# Patient Record
Sex: Female | Born: 1970 | Race: White | Hispanic: No | Marital: Married | Smoking: Current every day smoker
Health system: Southern US, Community
[De-identification: ages and names within clinical notes are randomized; demographics above are authoritative.]

## PROBLEM LIST (undated history)

## (undated) DIAGNOSIS — G8929 Other chronic pain: Secondary | ICD-10-CM

## (undated) DIAGNOSIS — E039 Hypothyroidism, unspecified: Secondary | ICD-10-CM

## (undated) DIAGNOSIS — M545 Low back pain: Secondary | ICD-10-CM

## (undated) DIAGNOSIS — F419 Anxiety disorder, unspecified: Secondary | ICD-10-CM

## (undated) DIAGNOSIS — M549 Dorsalgia, unspecified: Secondary | ICD-10-CM

## (undated) DIAGNOSIS — M5126 Other intervertebral disc displacement, lumbar region: Secondary | ICD-10-CM

## (undated) DIAGNOSIS — T7840XA Allergy, unspecified, initial encounter: Secondary | ICD-10-CM

## (undated) DIAGNOSIS — M5416 Radiculopathy, lumbar region: Secondary | ICD-10-CM

## (undated) HISTORY — DX: Anxiety disorder, unspecified: F41.9

## (undated) HISTORY — DX: Other intervertebral disc displacement, lumbar region: M51.26

## (undated) HISTORY — PX: CARPAL TUNNEL RELEASE: SHX101

## (undated) HISTORY — PX: BACK SURGERY: SHX140

## (undated) HISTORY — DX: Low back pain: M54.5

## (undated) HISTORY — DX: Dorsalgia, unspecified: M54.9

## (undated) HISTORY — DX: Radiculopathy, lumbar region: M54.16

## (undated) HISTORY — PX: TUBAL LIGATION: SHX77

## (undated) HISTORY — DX: Allergy, unspecified, initial encounter: T78.40XA

## (undated) HISTORY — PX: ABDOMINAL HYSTERECTOMY: SHX81

## (undated) HISTORY — PX: SPINE SURGERY: SHX786

---

## 1999-11-10 ENCOUNTER — Emergency Department (HOSPITAL_COMMUNITY): Admission: EM | Admit: 1999-11-10 | Discharge: 1999-11-10 | Payer: Self-pay | Admitting: Emergency Medicine

## 2007-05-17 ENCOUNTER — Ambulatory Visit: Payer: Self-pay | Admitting: Emergency Medicine

## 2010-04-25 ENCOUNTER — Ambulatory Visit: Payer: Self-pay

## 2010-10-12 ENCOUNTER — Ambulatory Visit: Payer: Self-pay | Admitting: Family Medicine

## 2010-10-14 ENCOUNTER — Ambulatory Visit: Payer: Self-pay | Admitting: Internal Medicine

## 2011-04-12 ENCOUNTER — Ambulatory Visit: Payer: Self-pay

## 2011-04-25 ENCOUNTER — Ambulatory Visit: Payer: Self-pay

## 2011-04-26 LAB — PATHOLOGY REPORT

## 2011-05-04 ENCOUNTER — Ambulatory Visit: Payer: Self-pay

## 2014-01-29 ENCOUNTER — Other Ambulatory Visit: Payer: Self-pay | Admitting: Family Medicine

## 2014-01-29 ENCOUNTER — Other Ambulatory Visit (HOSPITAL_COMMUNITY)
Admission: RE | Admit: 2014-01-29 | Discharge: 2014-01-29 | Disposition: A | Discharge status: Home / Self Care | Payer: 59 | Source: Ambulatory Visit | Attending: Family Medicine | Admitting: Family Medicine

## 2014-01-29 DIAGNOSIS — Z124 Encounter for screening for malignant neoplasm of cervix: Secondary | ICD-10-CM | POA: Insufficient documentation

## 2015-11-02 ENCOUNTER — Other Ambulatory Visit: Payer: Self-pay | Admitting: Neurosurgery

## 2015-11-02 ENCOUNTER — Ambulatory Visit
Admission: RE | Admit: 2015-11-02 | Discharge: 2015-11-02 | Disposition: A | Discharge status: Home / Self Care | Payer: 59 | Source: Ambulatory Visit | Attending: Neurosurgery | Admitting: Neurosurgery

## 2015-11-02 DIAGNOSIS — M5416 Radiculopathy, lumbar region: Secondary | ICD-10-CM

## 2015-11-02 MED ORDER — GADOBENATE DIMEGLUMINE 529 MG/ML IV SOLN
13.0000 mL | Freq: Once | INTRAVENOUS | Status: AC | PRN
  Administered 2015-11-02: 13 mL via INTRAVENOUS

## 2015-11-08 ENCOUNTER — Other Ambulatory Visit: Payer: Self-pay | Admitting: Neurosurgery

## 2015-11-10 NOTE — Pre-Procedure Instructions (Signed)
    Latoya Brady  11/10/2015      [facility] [phone] - [place] - [address] AT [facility] [address] [place] [id number] Phone: [phone] Fax: [phone]    Your procedure is scheduled on Tuesday, December 6th, 2016.  [facility] at 9:30 A.M.  Call this number if you have problems the morning of surgery:  [phone]   Remember:  Do not eat food or drink liquids after midnight.   Take these medicines the morning of surgery with A SIP OF WATER: Gabapentin (Neurontin), Levothyroxine (Synthroid), Tramadol (Ultram) if needed.  Stop taking: Aspirin, NSAIDS, Aleve, Naproxen, Ibuprofen, Advil, Motrin, BC's, Goody's, fish oil, all herbal medications, and all vitamins.    Do not wear jewelry, make-up or nail polish.  Do not wear lotions, powders, or perfumes.  You may wear deodorant.  Do not shave 48 hours prior to surgery.    Do not bring valuables to the hospital.  [facility] is not responsible for any belongings or valuables.  Contacts, dentures or bridgework may not be worn into surgery.  Leave your suitcase in the car.  After surgery it may be brought to your room.  For patients admitted to the hospital, discharge time will be determined by your treatment team.  Patients discharged the day of surgery will not be allowed to drive home.   Special instructions:  See attached.   Please read over the following fact sheets that you were given. Pain Booklet, Coughing and Deep Breathing, Blood Transfusion Information, MRSA Information and Surgical Site Infection Prevention

## 2015-11-11 ENCOUNTER — Encounter (HOSPITAL_COMMUNITY)
Admission: RE | Admit: 2015-11-11 | Discharge: 2015-11-11 | Disposition: A | Discharge status: Home / Self Care | Payer: 59 | Source: Ambulatory Visit | Attending: Neurosurgery | Admitting: Neurosurgery

## 2015-11-11 ENCOUNTER — Encounter (HOSPITAL_COMMUNITY): Payer: Self-pay

## 2015-11-11 DIAGNOSIS — Z0183 Encounter for blood typing: Secondary | ICD-10-CM | POA: Diagnosis not present

## 2015-11-11 DIAGNOSIS — Z01812 Encounter for preprocedural laboratory examination: Secondary | ICD-10-CM | POA: Insufficient documentation

## 2015-11-11 DIAGNOSIS — M5416 Radiculopathy, lumbar region: Secondary | ICD-10-CM | POA: Diagnosis not present

## 2015-11-11 HISTORY — DX: Hypothyroidism, unspecified: E03.9

## 2015-11-11 LAB — SURGICAL PCR SCREEN
MRSA, PCR: NEGATIVE
Staphylococcus aureus: NEGATIVE

## 2015-11-11 LAB — BASIC METABOLIC PANEL
ANION GAP: 10 (ref 5–15)
BUN: 9 mg/dL (ref 6–20)
CALCIUM: 9.7 mg/dL (ref 8.9–10.3)
CO2: 22 mmol/L (ref 22–32)
CREATININE: 0.59 mg/dL (ref 0.44–1.00)
Chloride: 108 mmol/L (ref 101–111)
Glucose, Bld: 90 mg/dL (ref 65–99)
Potassium: 4.2 mmol/L (ref 3.5–5.1)
SODIUM: 140 mmol/L (ref 135–145)

## 2015-11-11 LAB — CBC
HCT: 45.4 % (ref 36.0–46.0)
Hemoglobin: 15.8 g/dL — ABNORMAL HIGH (ref 12.0–15.0)
MCH: 30.6 pg (ref 26.0–34.0)
MCHC: 34.8 g/dL (ref 30.0–36.0)
MCV: 88 fL (ref 78.0–100.0)
PLATELETS: 254 10*3/uL (ref 150–400)
RBC: 5.16 MIL/uL — ABNORMAL HIGH (ref 3.87–5.11)
RDW: 13.1 % (ref 11.5–15.5)
WBC: 8.4 10*3/uL (ref 4.0–10.5)

## 2015-11-11 LAB — ABO/RH: ABO/RH(D): O POS

## 2015-11-11 LAB — TYPE AND SCREEN
ABO/RH(D): O POS
Antibody Screen: NEGATIVE

## 2015-11-11 NOTE — Progress Notes (Signed)
PCP is Dr Juluis RainierElizabeth Barnes Denies ever seeing a cardiologist. Denies ever having a card cath, stress test, or echo. Denies chest pain.

## 2015-11-15 MED ORDER — CEFAZOLIN SODIUM-DEXTROSE 2-3 GM-% IV SOLR
2.0000 g | INTRAVENOUS | Status: AC
  Administered 2015-11-16: 2 g via INTRAVENOUS
  Filled 2015-11-15 (×2): qty 50

## 2015-11-15 NOTE — H&P (Signed)
Latoya StampsSabrina E Brady is an 44 y.o. female.   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  Past Medical History  Diagnosis Date  . Hypothyroidism     Past Surgical History  Procedure Laterality Date  . Abdominal hysterectomy    . Back surgery    . Carpal tunnel release Bilateral     No family history on file. Social History:  reports that she has been smoking Cigarettes.  She has a 12.5 pack-year smoking history. She does not have any smokeless tobacco history on file. She reports that she does not drink alcohol or use illicit drugs.  Allergies:  Allergies  Allergen Reactions  . Macrobid [Nitrofurantoin [employer] CoMonohyd Macro]     Felt like I had pneumonia    No prescriptions prior to admission    No results found for this or any previous visit (from the past 48 hour(s)). No results found.  Review of Systems  Constitutional: Negative.   HENT: Negative.   Eyes: Negative.   Respiratory: Negative.   Cardiovascular: Negative.   Gastrointestinal: Negative.   Musculoskeletal: Positive for back pain.  Skin: Negative.   Neurological: Positive for sensory change and focal weakness.  Endo/Heme/Allergies: Negative.   Psychiatric/Behavioral: Negative.     There were no vitals taken for this visit. Physical Exam hent, nl. Neck, nk. Cv, nl. Lungs,clear. Abdomen, nl. Extremities, nl. NEURO  Can not sit. PF of right foot at 2/5. 3-4/ DF. SLR positive at 30 both. Mri shoiws recurrent hnp Assessment/Plan Patient to go ahead with decompression and fusion at l5-s1. She is aware of risks and benefits  Latoya Brady M 11/15/2015, 3:21 PM

## 2015-11-16 ENCOUNTER — Inpatient Hospital Stay (HOSPITAL_COMMUNITY)
Admission: RE | Admit: 2015-11-16 | Discharge: 2015-11-19 | DRG: 460 | Disposition: A | Discharge status: Home / Self Care | Payer: 59 | Source: Ambulatory Visit | Attending: Neurosurgery | Admitting: Neurosurgery

## 2015-11-16 ENCOUNTER — Encounter (HOSPITAL_COMMUNITY): Payer: Self-pay | Admitting: *Deleted

## 2015-11-16 ENCOUNTER — Inpatient Hospital Stay (HOSPITAL_COMMUNITY): Payer: 59 | Admitting: Anesthesiology

## 2015-11-16 ENCOUNTER — Encounter (HOSPITAL_COMMUNITY)
Admission: RE | Disposition: A | Discharge status: Home / Self Care | Payer: 59 | Source: Ambulatory Visit | Attending: Neurosurgery

## 2015-11-16 ENCOUNTER — Inpatient Hospital Stay (HOSPITAL_COMMUNITY): Payer: 59

## 2015-11-16 DIAGNOSIS — Z419 Encounter for procedure for purposes other than remedying health state, unspecified: Secondary | ICD-10-CM

## 2015-11-16 DIAGNOSIS — Z881 Allergy status to other antibiotic agents status: Secondary | ICD-10-CM

## 2015-11-16 DIAGNOSIS — M5126 Other intervertebral disc displacement, lumbar region: Secondary | ICD-10-CM

## 2015-11-16 DIAGNOSIS — E039 Hypothyroidism, unspecified: Secondary | ICD-10-CM | POA: Diagnosis present

## 2015-11-16 DIAGNOSIS — F17210 Nicotine dependence, cigarettes, uncomplicated: Secondary | ICD-10-CM | POA: Diagnosis present

## 2015-11-16 DIAGNOSIS — M5417 Radiculopathy, lumbosacral region: Secondary | ICD-10-CM | POA: Diagnosis present

## 2015-11-16 HISTORY — DX: Other intervertebral disc displacement, lumbar region: M51.26

## 2015-11-16 SURGERY — POSTERIOR LUMBAR FUSION 1 LEVEL
Anesthesia: General

## 2015-11-16 MED ORDER — MORPHINE SULFATE 2 MG/ML IV SOLN
INTRAVENOUS | Status: AC
  Filled 2015-11-16: qty 25

## 2015-11-16 MED ORDER — PROPOFOL 10 MG/ML IV BOLUS
INTRAVENOUS | Status: DC | PRN
  Administered 2015-11-16: 140 mg via INTRAVENOUS

## 2015-11-16 MED ORDER — FENTANYL CITRATE (PF) 250 MCG/5ML IJ SOLN
INTRAMUSCULAR | Status: AC
  Filled 2015-11-16: qty 5

## 2015-11-16 MED ORDER — PHENYLEPHRINE 40 MCG/ML (10ML) SYRINGE FOR IV PUSH (FOR BLOOD PRESSURE SUPPORT)
PREFILLED_SYRINGE | INTRAVENOUS | Status: AC
  Filled 2015-11-16: qty 10

## 2015-11-16 MED ORDER — ONDANSETRON HCL 4 MG/2ML IJ SOLN
INTRAMUSCULAR | Status: AC
  Administered 2015-11-16: 4 mg
  Filled 2015-11-16: qty 2

## 2015-11-16 MED ORDER — VANCOMYCIN HCL 1000 MG IV SOLR
INTRAVENOUS | Status: AC
  Filled 2015-11-16: qty 1000

## 2015-11-16 MED ORDER — PHENOL 1.4 % MT LIQD: 1.0000 | OROMUCOSAL | Status: DC | PRN

## 2015-11-16 MED ORDER — CEFAZOLIN SODIUM 1-5 GM-% IV SOLN
1.0000 g | Freq: Three times a day (TID) | INTRAVENOUS | Status: AC
  Administered 2015-11-16 – 2015-11-17 (×2): 1 g via INTRAVENOUS
  Filled 2015-11-16 (×2): qty 50

## 2015-11-16 MED ORDER — THROMBIN 5000 UNITS EX SOLR
OROMUCOSAL | Status: DC | PRN
  Administered 2015-11-16: 5 mL via TOPICAL

## 2015-11-16 MED ORDER — ONDANSETRON HCL 4 MG/2ML IJ SOLN
4.0000 mg | Freq: Four times a day (QID) | INTRAMUSCULAR | Status: DC | PRN
Start: 2015-11-16 — End: 2015-11-17
  Administered 2015-11-17: 4 mg via INTRAVENOUS
  Filled 2015-11-16: qty 2

## 2015-11-16 MED ORDER — SODIUM CHLORIDE 0.9 % IJ SOLN: 3.0000 mL | INTRAMUSCULAR | Status: DC | PRN

## 2015-11-16 MED ORDER — DEXAMETHASONE SODIUM PHOSPHATE 10 MG/ML IJ SOLN
INTRAMUSCULAR | Status: DC | PRN
  Administered 2015-11-16: 10 mg via INTRAVENOUS

## 2015-11-16 MED ORDER — 0.9 % SODIUM CHLORIDE (POUR BTL) OPTIME
TOPICAL | Status: DC | PRN
  Administered 2015-11-16: 1000 mL

## 2015-11-16 MED ORDER — FENTANYL CITRATE (PF) 250 MCG/5ML IJ SOLN
INTRAMUSCULAR | Status: DC | PRN
  Administered 2015-11-16 (×2): 50 ug via INTRAVENOUS
  Administered 2015-11-16: 100 ug via INTRAVENOUS
  Administered 2015-11-16 (×2): 50 ug via INTRAVENOUS

## 2015-11-16 MED ORDER — ONDANSETRON HCL 4 MG/2ML IJ SOLN
INTRAMUSCULAR | Status: AC
  Filled 2015-11-16: qty 6

## 2015-11-16 MED ORDER — OXYCODONE HCL 5 MG/5ML PO SOLN: 5.0000 mg | Freq: Once | ORAL | Status: DC | PRN

## 2015-11-16 MED ORDER — SODIUM CHLORIDE 0.9 % IJ SOLN: 9.0000 mL | INTRAMUSCULAR | Status: DC | PRN

## 2015-11-16 MED ORDER — ONDANSETRON HCL 4 MG/2ML IJ SOLN
INTRAMUSCULAR | Status: DC | PRN
  Administered 2015-11-16: 4 mg via INTRAVENOUS

## 2015-11-16 MED ORDER — NALOXONE HCL 0.4 MG/ML IJ SOLN: 0.4000 mg | INTRAMUSCULAR | Status: DC | PRN

## 2015-11-16 MED ORDER — LIDOCAINE HCL (CARDIAC) 20 MG/ML IV SOLN
INTRAVENOUS | Status: DC | PRN
  Administered 2015-11-16: 60 mg via INTRAVENOUS

## 2015-11-16 MED ORDER — MIDAZOLAM HCL 2 MG/2ML IJ SOLN
INTRAMUSCULAR | Status: AC
  Filled 2015-11-16: qty 2

## 2015-11-16 MED ORDER — SODIUM CHLORIDE 0.9 % IV SOLN: 250.0000 mL | INTRAVENOUS | Status: DC

## 2015-11-16 MED ORDER — MORPHINE SULFATE 2 MG/ML IV SOLN
INTRAVENOUS | Status: DC
  Administered 2015-11-16: 6 mg via INTRAVENOUS
  Administered 2015-11-16: 1.5 mg via INTRAVENOUS
  Administered 2015-11-17: 10.5 mg via INTRAVENOUS
  Administered 2015-11-17: 7.5 mg via INTRAVENOUS

## 2015-11-16 MED ORDER — OXYCODONE HCL 5 MG PO TABS: 5.0000 mg | ORAL_TABLET | Freq: Once | ORAL | Status: DC | PRN

## 2015-11-16 MED ORDER — MENTHOL 3 MG MT LOZG: 1.0000 | LOZENGE | OROMUCOSAL | Status: DC | PRN

## 2015-11-16 MED ORDER — ACETAMINOPHEN 650 MG RE SUPP: 650.0000 mg | RECTAL | Status: DC | PRN

## 2015-11-16 MED ORDER — LEVOTHYROXINE SODIUM 75 MCG PO TABS
75.0000 ug | ORAL_TABLET | Freq: Every day | ORAL | Status: DC
  Administered 2015-11-17 – 2015-11-19 (×3): 75 ug via ORAL
  Filled 2015-11-16: qty 1
  Filled 2015-11-16 (×3): qty 3
  Filled 2015-11-16 (×2): qty 1
  Filled 2015-11-16: qty 3
  Filled 2015-11-16: qty 1

## 2015-11-16 MED ORDER — OXYCODONE-ACETAMINOPHEN 5-325 MG PO TABS
1.0000 | ORAL_TABLET | ORAL | Status: DC | PRN
  Administered 2015-11-17: 2 via ORAL
  Administered 2015-11-17: 1 via ORAL
  Administered 2015-11-17 – 2015-11-18 (×3): 2 via ORAL
  Administered 2015-11-18: 1 via ORAL
  Administered 2015-11-18 – 2015-11-19 (×4): 2 via ORAL
  Filled 2015-11-16: qty 1
  Filled 2015-11-16 (×10): qty 2

## 2015-11-16 MED ORDER — ACETAMINOPHEN 325 MG PO TABS: 325.0000 mg | ORAL_TABLET | ORAL | Status: DC | PRN

## 2015-11-16 MED ORDER — SODIUM CHLORIDE 0.9 % IV SOLN
INTRAVENOUS | Status: DC
  Administered 2015-11-16: 21:00:00 via INTRAVENOUS

## 2015-11-16 MED ORDER — VANCOMYCIN HCL 1000 MG IV SOLR
INTRAVENOUS | Status: DC | PRN
  Administered 2015-11-16: 1000 mg via TOPICAL

## 2015-11-16 MED ORDER — PROPOFOL 10 MG/ML IV BOLUS
INTRAVENOUS | Status: AC
  Filled 2015-11-16: qty 20

## 2015-11-16 MED ORDER — ACETAMINOPHEN 325 MG PO TABS: 650.0000 mg | ORAL_TABLET | ORAL | Status: DC | PRN

## 2015-11-16 MED ORDER — ZOLPIDEM TARTRATE 5 MG PO TABS: 5.0000 mg | ORAL_TABLET | Freq: Every evening | ORAL | Status: DC | PRN

## 2015-11-16 MED ORDER — HYDROMORPHONE HCL 1 MG/ML IJ SOLN
INTRAMUSCULAR | Status: AC
  Filled 2015-11-16: qty 1

## 2015-11-16 MED ORDER — DIPHENHYDRAMINE HCL 50 MG/ML IJ SOLN: 12.5000 mg | Freq: Four times a day (QID) | INTRAMUSCULAR | Status: DC | PRN

## 2015-11-16 MED ORDER — ONDANSETRON HCL 4 MG/2ML IJ SOLN: 4.0000 mg | INTRAMUSCULAR | Status: DC | PRN

## 2015-11-16 MED ORDER — BUPIVACAINE LIPOSOME 1.3 % IJ SUSP
20.0000 mL | INTRAMUSCULAR | Status: AC
  Administered 2015-11-16: 20 mL
  Filled 2015-11-16: qty 20

## 2015-11-16 MED ORDER — PHENYLEPHRINE HCL 10 MG/ML IJ SOLN
INTRAMUSCULAR | Status: DC | PRN
  Administered 2015-11-16 (×3): 40 ug via INTRAVENOUS

## 2015-11-16 MED ORDER — DIAZEPAM 5 MG PO TABS
5.0000 mg | ORAL_TABLET | Freq: Four times a day (QID) | ORAL | Status: DC | PRN
  Administered 2015-11-16 – 2015-11-19 (×4): 5 mg via ORAL
  Filled 2015-11-16 (×4): qty 1

## 2015-11-16 MED ORDER — SENNOSIDES-DOCUSATE SODIUM 8.6-50 MG PO TABS: 1.0000 | ORAL_TABLET | Freq: Every evening | ORAL | Status: DC | PRN

## 2015-11-16 MED ORDER — DIPHENHYDRAMINE HCL 12.5 MG/5ML PO ELIX: 12.5000 mg | ORAL_SOLUTION | Freq: Four times a day (QID) | ORAL | Status: DC | PRN

## 2015-11-16 MED ORDER — ROCURONIUM BROMIDE 100 MG/10ML IV SOLN
INTRAVENOUS | Status: DC | PRN
  Administered 2015-11-16: 50 mg via INTRAVENOUS

## 2015-11-16 MED ORDER — GABAPENTIN 300 MG PO CAPS
300.0000 mg | ORAL_CAPSULE | Freq: Three times a day (TID) | ORAL | Status: DC
  Administered 2015-11-16 – 2015-11-19 (×8): 300 mg via ORAL
  Filled 2015-11-16 (×8): qty 1

## 2015-11-16 MED ORDER — HYDROMORPHONE HCL 1 MG/ML IJ SOLN
0.2500 mg | INTRAMUSCULAR | Status: DC | PRN
  Administered 2015-11-16 (×4): 0.5 mg via INTRAVENOUS

## 2015-11-16 MED ORDER — LACTATED RINGERS IV SOLN
INTRAVENOUS | Status: DC
  Administered 2015-11-16 (×3): via INTRAVENOUS

## 2015-11-16 MED ORDER — THROMBIN 20000 UNITS EX SOLR
CUTANEOUS | Status: DC | PRN
  Administered 2015-11-16: 20 mL via TOPICAL

## 2015-11-16 MED ORDER — ACETAMINOPHEN 160 MG/5ML PO SOLN: 325.0000 mg | ORAL | Status: DC | PRN

## 2015-11-16 MED ORDER — SODIUM CHLORIDE 0.9 % IJ SOLN
3.0000 mL | Freq: Two times a day (BID) | INTRAMUSCULAR | Status: DC
Start: 2015-11-16 — End: 2015-11-19
  Administered 2015-11-17 – 2015-11-18 (×2): 3 mL via INTRAVENOUS

## 2015-11-16 SURGICAL SUPPLY — 77 items
APL SKNCLS STERI-STRIP NONHPOA (GAUZE/BANDAGES/DRESSINGS) ×1
BENZOIN TINCTURE PRP APPL 2/3 (GAUZE/BANDAGES/DRESSINGS) ×3 IMPLANT
BLADE CLIPPER SURG (BLADE) IMPLANT
BUR ACORN 6.0 (BURR) ×2 IMPLANT
BUR ACORN 6.0MM (BURR) ×1
BUR MATCHSTICK NEURO 3.0 LAGG (BURR) ×5 IMPLANT
CANISTER SUCT 3000ML PPV (MISCELLANEOUS) ×3 IMPLANT
CAP LOCKING THREADED (Cap) ×8 IMPLANT
CLOSURE WOUND 1/2 X4 (GAUZE/BANDAGES/DRESSINGS) ×1
CONT SPEC 4OZ CLIKSEAL STRL BL (MISCELLANEOUS) ×3 IMPLANT
COVER BACK TABLE 60X90IN (DRAPES) ×3 IMPLANT
DRAPE C-ARM 42X72 X-RAY (DRAPES) ×6 IMPLANT
DRAPE LAPAROTOMY 100X72X124 (DRAPES) ×3 IMPLANT
DRAPE MICROSCOPE LEICA (MISCELLANEOUS) ×6 IMPLANT
DRAPE POUCH INSTRU U-SHP 10X18 (DRAPES) ×3 IMPLANT
DRSG OPSITE POSTOP 4X6 (GAUZE/BANDAGES/DRESSINGS) ×2 IMPLANT
DRSG PAD ABDOMINAL 8X10 ST (GAUZE/BANDAGES/DRESSINGS) IMPLANT
DURAPREP 26ML APPLICATOR (WOUND CARE) ×3 IMPLANT
ELECT BLADE 4.0 EZ CLEAN MEGAD (MISCELLANEOUS) ×6
ELECT REM PT RETURN 9FT ADLT (ELECTROSURGICAL) ×3
ELECTRODE BLDE 4.0 EZ CLN MEGD (MISCELLANEOUS) IMPLANT
ELECTRODE REM PT RTRN 9FT ADLT (ELECTROSURGICAL) ×1 IMPLANT
EVACUATOR 1/8 PVC DRAIN (DRAIN) IMPLANT
GAUZE SPONGE 4X4 12PLY STRL (GAUZE/BANDAGES/DRESSINGS) ×1 IMPLANT
GAUZE SPONGE 4X4 16PLY XRAY LF (GAUZE/BANDAGES/DRESSINGS) ×3 IMPLANT
GLOVE BIOGEL M 8.0 STRL (GLOVE) ×5 IMPLANT
GLOVE BIOGEL PI IND STRL 7.5 (GLOVE) IMPLANT
GLOVE BIOGEL PI INDICATOR 7.5 (GLOVE) ×2
GLOVE EXAM NITRILE LRG STRL (GLOVE) IMPLANT
GLOVE EXAM NITRILE MD LF STRL (GLOVE) IMPLANT
GLOVE EXAM NITRILE XL STR (GLOVE) IMPLANT
GLOVE EXAM NITRILE XS STR PU (GLOVE) ×2 IMPLANT
GLOVE SS BIOGEL STRL SZ 7 (GLOVE) IMPLANT
GLOVE SUPERSENSE BIOGEL SZ 7 (GLOVE) ×2
GOWN STRL REUS W/ TWL LRG LVL3 (GOWN DISPOSABLE) ×1 IMPLANT
GOWN STRL REUS W/ TWL XL LVL3 (GOWN DISPOSABLE) IMPLANT
GOWN STRL REUS W/TWL 2XL LVL3 (GOWN DISPOSABLE) IMPLANT
GOWN STRL REUS W/TWL LRG LVL3 (GOWN DISPOSABLE) ×6
GOWN STRL REUS W/TWL XL LVL3 (GOWN DISPOSABLE) ×9
KIT BASIN OR (CUSTOM PROCEDURE TRAY) ×3 IMPLANT
KIT INFUSE MEDIUM (Orthopedic Implant) ×2 IMPLANT
KIT ROOM TURNOVER OR (KITS) ×3 IMPLANT
NDL HYPO 18GX1.5 BLUNT FILL (NEEDLE) IMPLANT
NDL HYPO 21X1.5 SAFETY (NEEDLE) IMPLANT
NDL HYPO 25X1 1.5 SAFETY (NEEDLE) IMPLANT
NEEDLE HYPO 18GX1.5 BLUNT FILL (NEEDLE) IMPLANT
NEEDLE HYPO 21X1.5 SAFETY (NEEDLE) ×3 IMPLANT
NEEDLE HYPO 25X1 1.5 SAFETY (NEEDLE) ×3 IMPLANT
NS IRRIG 1000ML POUR BTL (IV SOLUTION) ×3 IMPLANT
PACK LAMINECTOMY NEURO (CUSTOM PROCEDURE TRAY) ×3 IMPLANT
PAD ARMBOARD 7.5X6 YLW CONV (MISCELLANEOUS) ×9 IMPLANT
PATTIES SURGICAL .5 X1 (DISPOSABLE) ×3 IMPLANT
PATTIES SURGICAL .5 X3 (DISPOSABLE) IMPLANT
PATTIES SURGICAL 1X1 (DISPOSABLE) ×2 IMPLANT
PENCIL BUTTON HOLSTER BLD 10FT (ELECTRODE) ×2 IMPLANT
ROD CREO 45MM SPINAL (Rod) ×4 IMPLANT
RUBBERBAND STERILE (MISCELLANEOUS) ×12 IMPLANT
SCREW 5.5X35MM (Screw) ×9 IMPLANT
SCREW BN 35X5.5XPA (Screw) IMPLANT
SCREW SPINE 40X5.5XPA CREO (Screw) IMPLANT
SCREW SPINE CREO 5.5X40 (Screw) ×3 IMPLANT
SPACER RISE 8X22 11-17MM-15 (Spacer) ×4 IMPLANT
SPONGE LAP 4X18 X RAY DECT (DISPOSABLE) IMPLANT
SPONGE NEURO XRAY DETECT 1X3 (DISPOSABLE) IMPLANT
SPONGE SURGIFOAM ABS GEL 100 (HEMOSTASIS) ×3 IMPLANT
STRIP CLOSURE SKIN 1/2X4 (GAUZE/BANDAGES/DRESSINGS) ×2 IMPLANT
SUT PROLENE 5 0 C1 (SUTURE) ×2 IMPLANT
SUT VIC AB 1 CT1 18XBRD ANBCTR (SUTURE) ×2 IMPLANT
SUT VIC AB 1 CT1 8-18 (SUTURE) ×3
SUT VIC AB 2-0 CP2 18 (SUTURE) ×3 IMPLANT
SUT VIC AB 3-0 SH 8-18 (SUTURE) ×3 IMPLANT
SWAB CULTURE ESWAB REG 1ML (MISCELLANEOUS) ×2 IMPLANT
SYR 5ML LL (SYRINGE) IMPLANT
TOWEL OR 17X24 6PK STRL BLUE (TOWEL DISPOSABLE) ×3 IMPLANT
TOWEL OR 17X26 10 PK STRL BLUE (TOWEL DISPOSABLE) ×3 IMPLANT
TRAY FOLEY W/METER SILVER 14FR (SET/KITS/TRAYS/PACK) ×3 IMPLANT
WATER STERILE IRR 1000ML POUR (IV SOLUTION) ×3 IMPLANT

## 2015-11-16 NOTE — Anesthesia Procedure Notes (Signed)
Procedure Name: Intubation Date/Time: 11/16/2015 1:17 PM Performed by: Marena ChancyBECKNER, Edelin Fryer S Pre-anesthesia Checklist: Patient identified, Timeout performed, Emergency Drugs available, Suction available and Patient being monitored Patient Re-evaluated:Patient Re-evaluated prior to inductionOxygen Delivery Method: Circle system utilized Preoxygenation: Pre-oxygenation with 100% oxygen Intubation Type: IV induction Ventilation: Mask ventilation without difficulty Laryngoscope Size: Miller and Mac Grade View: Grade I Tube type: Oral Tube size: 7.0 mm Number of attempts: 1 Placement Confirmation: ETT inserted through vocal cords under direct vision,  breath sounds checked- equal and bilateral and positive ETCO2 Tube secured with: Tape Dental Injury: Teeth and Oropharynx as per pre-operative assessment

## 2015-11-16 NOTE — Transfer of Care (Signed)
Immediate Anesthesia Transfer of Care Note  Patient: Latoya StampsSabrina E Brady  Procedure(s) Performed: Procedure(s) with comments: Lumbar Five-Sacral One Posterior lumbar interbody fusionwith Pedicle Screw Fixation (N/A) - Lumbar Five-Sacral One Posterior lumbar interbody fusionwith Pedicle Screw Fixation  Patient Location: PACU  Anesthesia Type:General  Level of Consciousness: awake, alert  and patient cooperative  Airway & Oxygen Therapy: Patient Spontanous Breathing and Patient connected to nasal cannula oxygen  Post-op Assessment: Report given to RN, Post -op Vital signs reviewed and stable and Patient moving all extremities  Post vital signs: Reviewed and stable  Last Vitals:  Filed Vitals:   11/16/15 0942 11/16/15 1715  BP: 122/89   Pulse: 93   Temp: 36.9 C 36.7 C  Resp: 20     Complications: No apparent anesthesia complications

## 2015-11-16 NOTE — Progress Notes (Signed)
Assessment performed by D. Melina Fiddleraye, RN

## 2015-11-16 NOTE — Anesthesia Preprocedure Evaluation (Signed)
Anesthesia Evaluation  Patient identified by MRN, date of birth, ID band Patient awake    Reviewed: Allergy & Precautions, NPO status , Patient's Chart, lab work & pertinent test results  History of Anesthesia Complications Negative for: history of anesthetic complications  Airway Mallampati: II  TM Distance: >3 FB Neck ROM: Full    Dental  (+) Teeth Intact   Pulmonary neg shortness of breath, neg sleep apnea, neg COPD, neg recent URI, Current Smoker, neg PE   breath sounds clear to auscultation       Cardiovascular negative cardio ROS   Rhythm:Regular     Neuro/Psych neg Seizures  Neuromuscular disease negative neurological ROS  negative psych ROS   GI/Hepatic negative GI ROS, Neg liver ROS,   Endo/Other  Hypothyroidism   Renal/GU negative Renal ROS     Musculoskeletal negative musculoskeletal ROS (+)   Abdominal   Peds  Hematology negative hematology ROS (+)   Anesthesia Other Findings   Reproductive/Obstetrics                             Anesthesia Physical Anesthesia Plan  ASA: II  Anesthesia Plan: General   Post-op Pain Management:    Induction: Intravenous  Airway Management Planned: Oral ETT  Additional Equipment: None  Intra-op Plan:   Post-operative Plan: Extubation in OR  Informed Consent: I have reviewed the patients History and Physical, chart, labs and discussed the procedure including the risks, benefits and alternatives for the proposed anesthesia with the patient or authorized representative who has indicated his/her understanding and acceptance.   Dental advisory given  Plan Discussed with: CRNA and Surgeon  Anesthesia Plan Comments:         Anesthesia Quick Evaluation

## 2015-11-17 MED ORDER — OXYCODONE HCL 5 MG PO TABS
10.0000 mg | ORAL_TABLET | ORAL | Status: DC | PRN
  Administered 2015-11-17 – 2015-11-19 (×8): 10 mg via ORAL
  Filled 2015-11-17 (×8): qty 2

## 2015-11-17 MED ORDER — DOCUSATE SODIUM 100 MG PO CAPS
100.0000 mg | ORAL_CAPSULE | Freq: Two times a day (BID) | ORAL | Status: DC
  Administered 2015-11-17 – 2015-11-19 (×4): 100 mg via ORAL
  Filled 2015-11-17 (×4): qty 1

## 2015-11-17 MED FILL — Heparin Sodium (Porcine) Inj 1000 Unit/ML: INTRAMUSCULAR | Qty: 30 | Status: AC

## 2015-11-17 MED FILL — Sodium Chloride IV Soln 0.9%: INTRAVENOUS | Qty: 1000 | Status: AC

## 2015-11-17 NOTE — Evaluation (Signed)
Physical Therapy Evaluation Patient Details Name: Latoya Brady MRN: [id number] DOB: 01-Jul-1971 Today's Date: 11/17/2015   History of Present Illness  s/p L5-S1 fusion  PMHx- discectomy L5-S1 06/2015 (pain returned/worsened after ~1 month per pt)  Clinical Impression  Patient is s/p above surgery resulting in the deficits listed below (see PT Problem List). Pt currently limited by severe pain and back brace not yet delivered. Pt was only able to tolerate sitting <5 minutes PTA and was the same today with pt pushing on armrest to lateral lean to left and relieve pressure on Rt LE. Discussed benefits of upright position and pt eager to walk vs sit. RN notified.  Patient will benefit from skilled PT to increase their independence and safety with mobility (while adhering to their precautions) to allow discharge to the venue listed below.     Follow Up Recommendations No PT follow up;Supervision for mobility/OOB    Equipment Recommendations  Rolling walker with 5" wheels    Recommendations for Other Services OT consult     Precautions / Restrictions Precautions Precautions: Back;Fall Precaution Booklet Issued: Yes (comment) Required Braces or Orthoses: Spinal Brace Spinal Brace: Lumbar corset;Applied in sitting position (brace not yet delivered)      Mobility  Bed Mobility Overal bed mobility: Needs Assistance Bed Mobility: Sit to Sidelying         Sit to sidelying: Min guard General bed mobility comments: vc for technique to prevent twisting; pt familiar with technique  Transfers Overall transfer level: Needs assistance Equipment used: Rolling walker (2 wheeled);None Transfers: Sit to/from [employer] to Stand: Min assist Stand pivot transfers: Min assist          Ambulation/Gait Ambulation/Gait assistance: Min assist Ambulation Distance (Feet): 3 Feet Assistive device: Rolling walker (2 wheeled) Gait Pattern/deviations: Step-to  pattern;Decreased stride length     General Gait Details: pivotal steps BSC to chair, chair to bed (no brace yet)  Stairs            Wheelchair Mobility    Modified Rankin (Stroke Patients Only)       Balance Overall balance assessment: Needs assistance         Standing balance support: No upper extremity supported Standing balance-Leahy Scale: Poor                               Pertinent Vitals/Pain Pain Assessment: 0-10 Pain Score: 10-Worst pain ever Pain Location: Back, down rt leg Pain Descriptors / Indicators: Operative site guarding;Discomfort Pain Intervention(s): Limited activity within patient's tolerance;Monitored during session;Premedicated before session;Repositioned    Home Living Family/patient expects to be discharged to:: Private residence Living Arrangements: Spouse/significant other Available Help at Discharge: Family;Available 24 hours/day Type of Home: House Home Access: Stairs to enter Entrance Stairs-Rails: Right (then bil, close together) Entrance Stairs-Number of Steps: 2+2 Home Layout: One level Home Equipment: None      Prior Function Level of Independence: Independent               Hand Dominance        Extremity/Trunk Assessment   Upper Extremity Assessment: Defer to OT evaluation;Overall WFL for tasks assessed           Lower Extremity Assessment: RLE deficits/detail;LLE deficits/detail      Cervical / Trunk Assessment: Other exceptions  Communication   Communication: No difficulties  Cognition Arousal/Alertness: Awake/alert Behavior During Therapy: WFL for tasks assessed/performed Overall Cognitive Status: Within  Functional Limits for tasks assessed                      General Comments      Exercises        Assessment/Plan    PT Assessment Patient needs continued PT services  PT Diagnosis Difficulty walking;Acute pain   PT Problem List Decreased activity  tolerance;Decreased balance;Decreased mobility;Decreased knowledge of use of DME;Decreased knowledge of precautions;Impaired sensation;Pain  PT Treatment Interventions DME instruction;Gait training;Stair training;Functional mobility training;Therapeutic activities;Patient/family education   PT Goals (Current goals can be found in the Care Plan section) Acute Rehab PT Goals Patient Stated Goal: decr pain and take care of herself PT Goal Formulation: With patient Time For Goal Achievement: 11/22/15 Potential to Achieve Goals: Good    Frequency Min 5X/week   Barriers to discharge        Co-evaluation               End of Session Equipment Utilized During Treatment: Oxygen Activity Tolerance: Patient limited by pain;Other (comment) (no brace yet) Patient left: in bed;with call bell/phone within reach;with bed alarm set;with family/visitor present Nurse Communication: Mobility status;Other (comment) (pain incr 5 to 10 with sitting; could only sit 5 min PTA)         Time: [id number] PT Time Calculation (min) (ACUTE ONLY): 26 min   Charges:   PT Evaluation $Initial PT Evaluation Tier I: 1 Procedure PT Treatments $Therapeutic Activity: 8-22 mins   PT G Codes:        Ancel Easler 11/17/2015, 8:54 AM Pager [phone]

## 2015-11-17 NOTE — Progress Notes (Signed)
Occupational Therapy Evaluation Patient Details Name: Latoya Brady MRN: [id number] DOB: [birth year] Today's Date: 11/17/2015    History of Present Illness s/p L5-S1 fusion  PMHx- discectomy L5-S1 06/2015 (pain returned/worsened after ~1 month per pt)   Clinical Impression   PTA, pt was independent with all ADL and functional mobility. Session limited by back brace not yet delivered. Pt completed toilet transfer and grooming tasks at sink with min guard assist for safety and min verbal cues to adhere to back precautions. Pt will benefit from skilled OT to increase independence and safety with ADLs and mobility to allow d/c home with assist from family.     Follow Up Recommendations  No OT follow up;Supervision - Intermittent    Equipment Recommendations  Other (comment);Tub/shower bench (RW-2 wheeled)    Recommendations for Other Services       Precautions / Restrictions Precautions Precautions: Back;Fall Precaution Comments: Pt able to recall 3/3 back precautions at beginning of session. Required Braces or Orthoses: Spinal Brace Spinal Brace: Lumbar corset;Applied in sitting position (brace not yet delivered) Restrictions Weight Bearing Restrictions: No      Mobility Bed Mobility               General bed mobility comments: Pt sitting EOB on arrival. Reviewed log roll technique for when pt ready to lay back down.   Transfers Overall transfer level: Needs assistance Equipment used: Rolling walker (2 wheeled);None Transfers: Sit to/from Stand Sit to Stand: Min guard         General transfer comment: Min guard for safety as pt reported not feeling steady on her feet yet. No overt LOB and no physical assist required    Balance Overall balance assessment: Needs assistance Sitting-balance support: No upper extremity supported;Feet supported Sitting balance-Leahy Scale: Fair     Standing balance support: No upper extremity supported;During functional  activity Standing balance-Leahy Scale: Fair                              ADL Overall ADL's : Needs assistance/impaired     Grooming: Oral care;Wash/dry hands;Min guard;Standing                   Toilet Transfer: Min guard;Ambulation;Comfort height toilet;Grab bars;RW   Toileting- Architect and Hygiene: Min guard;Adhering to back precautions;Sitting/lateral lean       Functional mobility during ADLs: Supervision/safety;Rolling walker General ADL Comments: Session limited by pt's back brace not delivered to pt's room yet. Pt demonstrating good adherence to back precautions during transfers and self-care tasks. Educated pt on two cup method for oral care and showed AE for LB ADLs. Discussed safe footwear and other fall prevention strategies with pt and husband.      Vision Vision Assessment?: No apparent visual deficits   Perception     Praxis      Pertinent Vitals/Pain Pain Assessment: 0-10 Pain Score: 2  Pain Location: back and R buttock and leg Pain Descriptors / Indicators: Aching;Operative site guarding;Discomfort Pain Intervention(s): Limited activity within patient's tolerance;Monitored during session;Repositioned;Premedicated before session     Hand Dominance Left   Extremity/Trunk Assessment Upper Extremity Assessment Upper Extremity Assessment: Overall WFL for tasks assessed   Lower Extremity Assessment Lower Extremity Assessment: RLE deficits/detail RLE Deficits / Details: Pt says R leg is numb from hip to knee and also her foot.  RLE Sensation: decreased light touch   Cervical / Trunk Assessment Cervical / Trunk Assessment: Normal  Communication Communication Communication: No difficulties   Cognition Arousal/Alertness: Awake/alert Behavior During Therapy: WFL for tasks assessed/performed Overall Cognitive Status: Within Functional Limits for tasks assessed                     General Comments        Exercises       Shoulder Instructions      Home Living Family/patient expects to be discharged to:: Private residence Living Arrangements: Spouse/significant other Available Help at Discharge: Family;Available 24 hours/day Type of Home: House Home Access: Stairs to enter [employer] CorporationEntrance Stairs-Number of Steps: 2+2 Entrance Stairs-Rails: Right Home Layout: One level     Bathroom Shower/Tub: Tub/shower unit;Door Shower/tub characteristics: Door Bathroom Toilet: Handicapped height     Home Equipment: None   Additional Comments: Husband works during the day. Pt says she can have someone with her all the time if needed      Prior Functioning/Environment Level of Independence: Independent             OT Diagnosis: Acute pain   OT Problem List: Decreased strength;Decreased activity tolerance;Decreased range of motion;Impaired balance (sitting and/or standing);Decreased coordination;Decreased safety awareness;Decreased knowledge of use of DME or AE;Decreased knowledge of precautions;Pain   OT Treatment/Interventions: Self-care/ADL training;Therapeutic exercise;DME and/or AE instruction;Therapeutic activities;Patient/family education;Balance training    OT Goals(Current goals can be found in the care plan section) Acute Rehab OT Goals Patient Stated Goal: decr pain and take care of herself OT Goal Formulation: With patient Time For Goal Achievement: 12/01/15 Potential to Achieve Goals: Good ADL Goals Pt Will Perform Lower Body Bathing: with supervision;with adaptive equipment;sitting/lateral leans;sit to/from stand Pt Will Perform Lower Body Dressing: with supervision;with adaptive equipment;sitting/lateral leans;sit to/from stand Pt Will Transfer to Toilet: ambulating;regular height toilet;with modified independence;grab bars Pt Will Perform Toileting - Clothing Manipulation and hygiene: with modified independence;with adaptive equipment;sitting/lateral leans;sit to/from stand Pt  Will Perform Tub/Shower Transfer: Tub transfer;with supervision;ambulating;shower seat;rolling walker Additional ADL Goal #1: Pt will demonstrate understanding of 3/3 back precautions during functional tasks. Additional ADL Goal #2: Pt will independently don/doff back brace with no verbal cues.  OT Frequency: Min 2X/week   Barriers to D/C:            Co-evaluation              End of Session Equipment Utilized During Treatment: Gait belt;Rolling walker Nurse Communication: Mobility status;Precautions;Other (comment) (Pt sitting EOB)  Activity Tolerance: Patient tolerated treatment well Patient left: in bed;with call bell/phone within reach;with family/visitor present   Time: 1600-1630 OT Time Calculation (min): 30 min Charges:  OT General Charges $OT Visit: 1 Procedure OT Evaluation $Initial OT Evaluation Tier I: 1 Procedure OT Treatments $Self Care/Home Management : 8-22 mins G-Codes:    Nils PyleJulia Adelita Hone, OTR/L 11/17/2015, 4:53 PM

## 2015-11-17 NOTE — Op Note (Signed)
NAMETANETTA, FUHRIMAN NO.:  [id number]  MEDICAL RECORD NO.:  [id number]  LOCATION:  5C17C                        FACILITY:  [facility]  PHYSICIAN:  Hilda Lias, M.D.   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  PROCEDURES:  Bilaterally L5-S1 diskectomy, lysis of adhesion, interbody fusion with cages, pedicle screws L5-S1, posterolateral arthrodesis with autograft and BMP, Cell Saver, C-arm.  SURGEON:  Hilda Lias, M.D.  ASSISTANT:  Dr. Theresia Lo.  CLINICAL HISTORY:  Ms. Basso is a lady who underwent right L5-S1 diskectomy about 5 months ago.  She did well at the beginning, but later on, she was having more pain down to the right leg associated with weakness.  For the past 8 weeks, she is worse being unable to sit down. MRI showed that she has recurrent disk bilaterally at L5-S1 with adhesion.  Surgery was advised.  She knew the risk and benefit of surgery.  DESCRIPTION OF PROCEDURE:  The patient was taken to the OR, and after intubation, she was positioned in a prone manner.  The back was cleaned with ChloraPrep.  Midline incision resecting the previous scar was done through the skin, subcutaneous tissue, straight down to the L5-S1 space. There was a pocket of fluid and specimen was sent to the lab.  It seems there was old CSF or left over from the epidural injection. Nevertheless, we proceeded with removal of the spinous process of L5 and we started at the left side with removal of the lamina and the facet. The patient had quite a bit of adhesion and we had to bring the microscope to be to decompress the right side.  Then after lysis of adhesion, we entered the disk space from the left side  first.  The disk was quite calcified and we had to use micro curette, and the drill to be able to get into the disk space.  The same procedure was done on the right side which took [place] at least 45 minutes.  At the L5-S1, especially the L5 nerve root was completely adherent to the floor of the spine.  At the end, we have a good decompression of the L5-S1 nerve root.  We introduced 2 cages which were expanded to 14.  They were lordotic with BMP and autograft inside.  After we removed the yellow ligament close to the lower part of L5, there was a small midline opening probably from a previous epidural injection which was taken care with 1 single stitches of Prolene.  Valsalva maneuver was negative.  Then, we made a hole in the pedicle of L5-S1.  Prior to introduction of the pedicle, we felt all 4 quadrants just to be sure that we were surrounded by bone.  Four screws of 5.5 x 40 and 35 were inserted and kept in place with rods and caps.  Then, we went laterally and we removed the periosteum of L5-S1 and a mix of BMP and autograft were used for arthrodesis.  The area was irrigated.  Again, second Valsalva maneuver was negative.  Vancomycin powder was left in the epidural space and the wound was closed with Vicryl and Steri- Strips.          ______________________________ Hilda LiasErnesto Keenon Leitzel, M.D.     EB/MEDQ  D:  11/16/2015  T:  11/17/2015  Job:  [id number]

## 2015-11-17 NOTE — Anesthesia Postprocedure Evaluation (Signed)
Anesthesia Post Note  Patient: Martie LeeSabrina E Elamin  Procedure(s) Performed: Procedure(s) (LRB): Lumbar Five-Sacral One Posterior lumbar interbody fusionwith Pedicle Screw Fixation (N/A)  Patient location during evaluation: PACU Anesthesia Type: General Level of consciousness: awake and alert and patient cooperative Pain management: pain level controlled Vital Signs Assessment: post-procedure vital signs reviewed and stable Respiratory status: spontaneous breathing and respiratory function stable Cardiovascular status: stable Anesthetic complications: no    Last Vitals:  Filed Vitals:   11/17/15 0445 11/17/15 0607  BP:  100/60  Pulse:  75  Temp:  36.8 C  Resp: 15 19    Last Pain:  Filed Vitals:   11/17/15 0824  PainSc: 3                  Shonnie Poudrier S

## 2015-11-17 NOTE — Progress Notes (Signed)
Patient ID: Latoya MuscaSabrina E Marsee, female   DOB: [birth year], 44 y.o.   MRN: [id number] No weakness, pain in lumbar area. Foley out. Hungry. Pt saw her.

## 2015-11-18 MED ORDER — POLYETHYLENE GLYCOL 3350 17 G PO PACK
17.0000 g | PACK | Freq: Every day | ORAL | Status: DC
  Administered 2015-11-18 – 2015-11-19 (×2): 17 g via ORAL
  Filled 2015-11-18 (×2): qty 1

## 2015-11-18 NOTE — Care Management Note (Signed)
Case Management Note  Patient Details  Name: Latoya Brady MRN: [id number] Date of Birth: May 23, 1971  Subjective/Objective:                    Action/Plan: Patient was admitted for a PLIF. Lives at home with spouse. Will follow for discharge needs pending patient progress and physician orders.  Expected Discharge Date:                  Expected Discharge Plan:  Home/Self Care  In-House Referral:     Discharge planning Services  CM Consult  Post Acute Care Choice:    Choice offered to:     DME Arranged:    DME Agency:     HH Arranged:    HH Agency:     Status of Service:  In process, will continue to follow  Medicare Important Message Given:    Date Medicare IM Given:    Medicare IM give by:    Date Additional Medicare IM Given:    Additional Medicare Important Message give by:     If discussed at Long Length of Stay Meetings, dates discussed:    Additional Comments:  Anda KraftRobarge, Kodi Steil C, RN 11/18/2015, 1:41 PM

## 2015-11-18 NOTE — Progress Notes (Signed)
[employer] paged regarding lumbar brace. [employer] aware of order, to deliver to patient today.

## 2015-11-18 NOTE — Progress Notes (Signed)
Occupational Therapy Treatment Patient Details Name: Latoya Brady MRN: [id number] DOB: Apr 21, 1971 Today's Date: 11/18/2015    History of present illness s/p L5-S1 fusion  PMHx- discectomy L5-S1 06/2015 (pain returned/worsened after ~1 month per pt)   OT comments  Pt's brace still not delivered to room. MD cleared pt to be up in room without brace and will send brace from his office at noon if pt's has not arrived from [employer]. Completed functional transfers and self-care tasks with supervision and good adherence to back precautions. Will continue to follow acutely.    Follow Up Recommendations  No OT follow up;Supervision - Intermittent    Equipment Recommendations  Other (comment) (RW-2 wheeled)    Recommendations for Other Services      Precautions / Restrictions Precautions Precautions: Back;Fall Precaution Comments: Pt recalled 3/3 back precautions and demonstrates understanding during fucntional tasks Required Braces or Orthoses: Spinal Brace Spinal Brace: Lumbar corset;Applied in sitting position (brace still not delivered) Restrictions Weight Bearing Restrictions: No       Mobility Bed Mobility Overal bed mobility: Needs Assistance Bed Mobility: Rolling;Sidelying to Sit Rolling: Supervision Sidelying to sit: Supervision       General bed mobility comments: HOB flat, use of bedrails. Good demonstration of log roll technique  Transfers Overall transfer level: Needs assistance Equipment used: Rolling walker (2 wheeled) Transfers: Sit to/from Stand Sit to Stand: Supervision         General transfer comment: Supervision for safety. Min verbal cues for safe hand placement    Balance Overall balance assessment: Needs assistance Sitting-balance support: No upper extremity supported;Feet supported Sitting balance-Leahy Scale: Good Sitting balance - Comments: Able to shift side to side for pressure relief due to R buttock pain   Standing balance support: No  upper extremity supported;During functional activity Standing balance-Leahy Scale: Fair                     ADL Overall ADL's : Needs assistance/impaired     Grooming: Wash/dry Multimedia programmer: Supervision/safety;Ambulation;Comfort height toilet;RW   Toileting- Clothing Manipulation and Hygiene: Supervision/safety;Adhering to back precautions;Sitting/lateral lean       Functional mobility during ADLs: Supervision/safety;Rolling walker General ADL Comments: Pt still does not have back brace in room. MD stopped by and cleared pt to be sitting up without brace and will send one from office if [employer] does not deliver one by noon. Pt is supervision for functional mobility and ADL tasks. Good adherence to back precautions and min cues for safe hand placement on seated surfaces and RW.      Vision                     Perception     Praxis      Cognition   Behavior During Therapy: WFL for tasks assessed/performed Overall Cognitive Status: Within Functional Limits for tasks assessed                       Extremity/Trunk Assessment               Exercises     Shoulder Instructions       General Comments      Pertinent Vitals/ Pain       Pain Assessment: 0-10 Pain Score: 3  Pain Location: Back and R buttock and leg Pain Descriptors / Indicators: Aching;Numbness  Pain Intervention(s): Limited activity within patient's tolerance;Monitored during session;Repositioned  Home Living                                          Prior Functioning/Environment              Frequency Min 2X/week     Progress Toward Goals  OT Goals(current goals can now be found in the care plan section)  Progress towards OT goals: Progressing toward goals  Acute Rehab OT Goals Patient Stated Goal: decr pain and take care of herself OT Goal Formulation: With patient Time For Goal Achievement:  12/01/15 Potential to Achieve Goals: Good ADL Goals Pt Will Perform Lower Body Bathing: with supervision;with adaptive equipment;sitting/lateral leans;sit to/from stand Pt Will Perform Lower Body Dressing: with supervision;with adaptive equipment;sitting/lateral leans;sit to/from stand Pt Will Transfer to Toilet: ambulating;regular height toilet;with modified independence;grab bars Pt Will Perform Toileting - Clothing Manipulation and hygiene: with modified independence;with adaptive equipment;sitting/lateral leans;sit to/from stand Pt Will Perform Tub/Shower Transfer: Tub transfer;with supervision;ambulating;shower seat;rolling walker Additional ADL Goal #1: Pt will demonstrate understanding of 3/3 back precautions during functional tasks. Additional ADL Goal #2: Pt will independently don/doff back brace with no verbal cues.  Plan Discharge plan remains appropriate    Co-evaluation                 End of Session Equipment Utilized During Treatment: Gait belt;Rolling walker   Activity Tolerance Patient tolerated treatment well   Patient Left in chair;with call bell/phone within reach;with chair alarm set;with family/visitor present   Nurse Communication Mobility status;Precautions;Other (comment) (MD sending brace at noon if [employer] does not send one)        Time: [id number] OT Time Calculation (min): 18 min  Charges: OT General Charges $OT Visit: 1 Procedure OT Treatments $Self Care/Home Management : 8-22 mins  Nils PyleJulia Costantino Kohlbeck, OTR/L 11/18/2015, 10:11 AM

## 2015-11-18 NOTE — Progress Notes (Signed)
Physical Therapy Treatment and Discharge Patient Details Name: Latoya Brady MRN: [id number] DOB: [birth year] Today's Date: 11/18/2015    History of Present Illness s/p L5-S1 fusion  PMHx- discectomy L5-S1 06/2015 (pain returned/worsened after ~1 month per pt)    PT Comments    Patient states pain much better now that brace arrived. Has completed all PT education and no further acute PT needs. Pt in agreement   Follow Up Recommendations  No PT follow up;Supervision for mobility/OOB     Equipment Recommendations  Rolling walker with 5" wheels    Recommendations for Other Services       Precautions / Restrictions Precautions Precautions: Back;Fall Precaution Comments: Pt recalled 3/3 back precautions and demonstrates understanding during fucntional tasks Required Braces or Orthoses: Spinal Brace Spinal Brace: Lumbar corset;Applied in sitting position (already donned on arrival)    Mobility  Bed Mobility               General bed mobility comments: verbalized correct technique; did not want to return to be  Transfers                    Ambulation/Gait Ambulation/Gait assistance: Modified independent (Device/Increase time) Ambulation Distance (Feet): 300 Feet Assistive device: Rolling walker (2 wheeled) Gait Pattern/deviations: Step-through pattern;Decreased stride length     General Gait Details: up with husband in hall; completed education while walking due to this is most comfortable position for her   Stairs Stairs:  (pt can verbalize correct technique)          Wheelchair Mobility    Modified Rankin (Stroke Patients Only)       Balance                                    Cognition Arousal/Alertness: Awake/alert Behavior During Therapy: WFL for tasks assessed/performed Overall Cognitive Status: Within Functional Limits for tasks assessed                      Exercises      General Comments General  comments (skin integrity, edema, etc.): Educated in car transfers.       Pertinent Vitals/Pain Pain Assessment: 0-10 Pain Score: 1  Pain Location: "butt" Pain Descriptors / Indicators: Discomfort Pain Intervention(s): Limited activity within patient's tolerance;Monitored during session    Home Living                      Prior Function            PT Goals (current goals can now be found in the care plan section) Acute Rehab PT Goals Patient Stated Goal: go home tomorrow PT Goal Formulation: With patient Time For Goal Achievement: 11/22/15 Potential to Achieve Goals: Good Progress towards PT goals: Goals met/education completed, patient discharged from PT    Frequency       PT Plan Current plan remains appropriate    Co-evaluation             End of Session   Activity Tolerance: Patient tolerated treatment well Patient left: with family/visitor present (walking in hall; RN aware)     Time: 5597-4163 PT Time Calculation (min) (ACUTE ONLY): 17 min  Charges:  $Gait Training: 8-22 mins                    G Codes:      Dasan Hardman  11/18/2015, 4:55 PM Pager [phone]

## 2015-11-18 NOTE — Progress Notes (Signed)
Patient ID: Latoya Brady, female   DOB: May 29, 1971, 44 y.o.   MRN: [id number] Better, ambulating but quite a bit of lumbar pain. No headache. waiting for the brace

## 2015-11-19 LAB — WOUND CULTURE
Culture: NO GROWTH
Gram Stain: NONE SEEN

## 2015-11-19 NOTE — Progress Notes (Signed)
Occupational Therapy Treatment/Discharge Patient Details Name: Latoya Brady MRN: [id number] DOB: Feb 11, 1971 Today's Date: 11/19/2015    History of present illness s/p L5-S1 fusion  PMHx- discectomy L5-S1 06/2015 (pain returned/worsened after ~1 month per pt)   OT comments  Pt states pain is much better and brace is very supportive. Pt completed all self-care tasks and functional transfers at with supervision-mod independence. Completed all OT education and pt has no further questions. Pt with no further acute OT needs. Pt only DME needs are a Rolling walker. OT signing off.    Follow Up Recommendations  No OT follow up;Supervision - Intermittent    Equipment Recommendations  Other (comment) (RW- 2 wheeled)    Recommendations for Other Services      Precautions / Restrictions Precautions Precautions: Back Precaution Comments: Pt recalled 3/3 back precautions and demonstrates understanding during fucntional tasks Required Braces or Orthoses: Spinal Brace Spinal Brace: Lumbar corset;Applied in sitting position Restrictions Weight Bearing Restrictions: No       Mobility Bed Mobility Overal bed mobility: Needs Assistance Bed Mobility: Sidelying to Sit Rolling: Modified independent (Device/Increase time)         General bed mobility comments: HOB flat, no use of bedrails to simulate home environment  Transfers Overall transfer level: Modified independent Equipment used: Rolling walker (2 wheeled) Transfers: Sit to/from Stand Sit to Stand: Modified independent (Device/Increase time)              Balance Overall balance assessment: Needs assistance Sitting-balance support: No upper extremity supported;Feet supported Sitting balance-Leahy Scale: Good     Standing balance support: No upper extremity supported;During functional activity Standing balance-Leahy Scale: Fair                     ADL Overall ADL's : Needs assistance/impaired     Grooming:  Wash/dry hands;Modified independent;Standing                   Toilet Transfer: Modified Independent;Ambulation;Regular Toilet;Grab bars;RW   Toileting- Clothing Manipulation and Hygiene: Modified independent;Sitting/lateral lean;Adhering to back precautions Toileting - Clothing Manipulation Details (indicate cue type and reason):    Tub/ Shower Transfer: Tub transfer;Supervision/safety;Ambulation;Rolling walker   Functional mobility during ADLs: Modified independent General ADL Comments: Pt demonstrating excellent adherence to back precautions. Educated/practiced with AE for toileting. Completed tub transfer with supervision - pt's husband will assist for showers at home for the next several weeks. Reviewed compensatory and fall prevention strategies      Vision                     Perception     Praxis      Cognition   Behavior During Therapy: WFL for tasks assessed/performed Overall Cognitive Status: Within Functional Limits for tasks assessed                       Extremity/Trunk Assessment               Exercises     Shoulder Instructions       General Comments      Pertinent Vitals/ Pain       Pain Assessment: 0-10 Pain Score: 2  Pain Location: Buttocks Pain Descriptors / Indicators: Aching;Discomfort;Grimacing Pain Intervention(s): Limited activity within patient's tolerance;Monitored during session;Repositioned  Home Living  Prior Functioning/Environment              Frequency       Progress Toward Goals  OT Goals(current goals can now be found in the care plan section)  Progress towards OT goals: Goals met/education completed, patient discharged from OT  Acute Rehab OT Goals Patient Stated Goal: to go home OT Goal Formulation: With patient Time For Goal Achievement: 12/01/15 Potential to Achieve Goals: Good ADL Goals Pt Will Perform Lower Body Bathing:  with supervision;with adaptive equipment;sitting/lateral leans;sit to/from stand Pt Will Perform Lower Body Dressing: with supervision;with adaptive equipment;sitting/lateral leans;sit to/from stand Pt Will Transfer to Toilet: ambulating;regular height toilet;with modified independence;grab bars Pt Will Perform Toileting - Clothing Manipulation and hygiene: with modified independence;with adaptive equipment;sitting/lateral leans;sit to/from stand Pt Will Perform Tub/Shower Transfer: Tub transfer;with supervision;ambulating;shower seat;rolling walker Additional ADL Goal #1: Pt will demonstrate understanding of 3/3 back precautions during functional tasks. Additional ADL Goal #2: Pt will independently don/doff back brace with no verbal cues.  Plan All goals met and education completed, patient discharged from OT services    Co-evaluation                 End of Session Equipment Utilized During Treatment: Rolling walker;Back brace   Activity Tolerance Patient tolerated treatment well   Patient Left in bed;with call bell/phone within reach;with family/visitor present   Nurse Communication Mobility status;Precautions;Other (comment) (Needs RW before d/c)        Time: 8978-4784 OT Time Calculation (min): 20 min  Charges: OT General Charges $OT Visit: 1 Procedure OT Treatments $Self Care/Home Management : 8-22 mins  Redmond Baseman, OTR/L 11/19/2015, 10:16 AM

## 2015-11-19 NOTE — Discharge Summary (Signed)
Physician Discharge Summary  Patient ID: Latoya Brady MRN: [id number] DOB/AGE: 01-16-1971 44 y.o.  Admit date: 11/16/2015 Discharge date: 11/19/2015  Admission Diagnoses:recurrent hnp lunbar  Discharge Diagnoses:  Active Problems:   Recurrent herniation of lumbar disc   Discharged Condition: incisional pain  Hospital Course: surgery  Consults: none  Significant Diagnostic Studies: mri  Treatments: l5s1 fusion  Discharge Exam: Blood pressure 114/69, pulse 98, temperature 98.7 F (37.1 C), temperature source Oral, resp. rate 20, height 5\' 2"  (1.575 m), weight 65.3 kg (143 lb 15.4 oz), SpO2 98 %. No weakness. Incisional pain  Disposition: Final discharge disposition not confirmed     Medication List    ASK your doctor about these medications        gabapentin 300 MG capsule  Commonly known as:  NEURONTIN  Take 300 mg by mouth 3 (three) times daily.     levothyroxine 75 MCG tablet  Commonly known as:  SYNTHROID, LEVOTHROID  Take 75 mcg by mouth daily before breakfast.     traMADol 50 MG tablet  Commonly known as:  ULTRAM  Take 50 mg by mouth every 6 (six) hours as needed for moderate pain.         Signed: Karn CassisBOTERO,[name] 11/19/2015, 10:36 AM

## 2015-11-19 NOTE — Discharge Instructions (Signed)
Spinal Fusion, Care After °Refer to this sheet in the next few weeks. These instructions provide you with information on caring for yourself after your procedure. Your caregiver may also give you more specific instructions. Your treatment has been planned according to current medical practices, but problems sometimes occur. Call your caregiver if you have any problems or questions after your procedure. °HOME CARE INSTRUCTIONS  °· Take whatever pain medicine has been prescribed by your caregiver. Do not take over-the-counter pain medicine unless directed otherwise by your caregiver. °· Do not drive if you are taking narcotic pain medicines. °· Change your bandage (dressing) if necessary or as directed by your caregiver. °· Do not get your surgical cut (incision) wet. After a few days you may take quick showers (rather than baths), but keep your incision clean and dry. Covering the incision with plastic wrap while you shower should keep your incision dry. A few weeks after surgery, once your incision has healed and your caregiver says it is okay, you can take baths or go swimming. °· If you have been prescribed medicine to prevent your blood from clotting, follow the directions carefully. °· Check the area around your incision often. Look for redness and swelling. Also, look for anything leaking from your wound. You can use a mirror or have a family member inspect your incision if it is in a place where it is difficult for you to see. °· Ask your caregiver what activities you should avoid and for how long. °· Walk as much as possible. °· Do not lift anything heavier than 10 pounds (4.5 kilograms) until your caregiver says it is safe. °· Do not twist or bend for a few weeks. Try not to pull on things. Avoid sitting for long periods of time. Change positions at least every hour. °· Ask your caregiver what kinds of exercise you should do to make your back stronger and when you should begin doing these exercises. °SEEK  IMMEDIATE MEDICAL CARE IF:  °· Pain suddenly becomes much worse. °· The incision area is red, swollen, bleeding, or leaking fluid. °· Your legs or feet become increasingly painful, numb, weak, or swollen. °· You have trouble controlling urination or bowel movements. °· You have trouble breathing. °· You have chest pain. °· You have a fever. °MAKE SURE YOU: °· Understand these instructions. °· Will watch your condition. °· Will get help right away if you are not doing well or get worse. °  °This information is not intended to replace advice given to you by your health care provider. Make sure you discuss any questions you have with your health care provider. °  °Document Released: 06/16/2005 Document Revised: 12/18/2014 Document Reviewed: 05/12/2015 °Elsevier Interactive Patient Education ©2016 [employer] ° °

## 2015-11-19 NOTE — Progress Notes (Signed)
Patient ID: Latoya MuscaSabrina E Casados, female   DOB: 1971/10/09, 44 y.o.   MRN: [id number] Upset because pain medications were withhold last night. Wants to g home were she can take better of herself. Home today with medications. Finally got the  brace yesterday pm

## 2015-11-19 NOTE — Progress Notes (Signed)
Patient discharged home with husband. Patient states Dr. Jeral FruitBotero gave prescriptions to her, in patient's possessions. RN discussed discharge instructions including pain control, follow up appointment, back precautions, infection preventions, s/sx to report to Dr. Jeral FruitBotero regarding infection. Patient vocalized understanding. Patient states Dr. Jeral FruitBotero told her to keep honeycomb dressing on until follow up appointment, may take quick showers. Patient escorted by guest services to car with husband.

## 2015-11-20 ENCOUNTER — Emergency Department (HOSPITAL_COMMUNITY)
Admission: EM | Admit: 2015-11-20 | Discharge: 2015-11-20 | Disposition: A | Discharge status: Home / Self Care | Payer: 59 | Attending: Emergency Medicine | Admitting: Emergency Medicine

## 2015-11-20 ENCOUNTER — Encounter (HOSPITAL_COMMUNITY): Payer: Self-pay | Admitting: Emergency Medicine

## 2015-11-20 DIAGNOSIS — M545 Low back pain, unspecified: Secondary | ICD-10-CM

## 2015-11-20 DIAGNOSIS — F17210 Nicotine dependence, cigarettes, uncomplicated: Secondary | ICD-10-CM | POA: Diagnosis not present

## 2015-11-20 DIAGNOSIS — Z79899 Other long term (current) drug therapy: Secondary | ICD-10-CM | POA: Diagnosis not present

## 2015-11-20 DIAGNOSIS — Z4801 Encounter for change or removal of surgical wound dressing: Secondary | ICD-10-CM | POA: Diagnosis present

## 2015-11-20 DIAGNOSIS — E039 Hypothyroidism, unspecified: Secondary | ICD-10-CM | POA: Insufficient documentation

## 2015-11-20 DIAGNOSIS — Z5189 Encounter for other specified aftercare: Secondary | ICD-10-CM

## 2015-11-20 MED ORDER — ONDANSETRON 4 MG PO TBDP
8.0000 mg | ORAL_TABLET | Freq: Once | ORAL | Status: AC
  Administered 2015-11-20: 8 mg via ORAL
  Filled 2015-11-20: qty 2

## 2015-11-20 MED ORDER — HYDROMORPHONE HCL 1 MG/ML IJ SOLN
2.0000 mg | Freq: Once | INTRAMUSCULAR | Status: AC
  Administered 2015-11-20: 2 mg via INTRAMUSCULAR
  Filled 2015-11-20: qty 2

## 2015-11-20 MED ORDER — KETOROLAC TROMETHAMINE 60 MG/2ML IM SOLN
60.0000 mg | Freq: Once | INTRAMUSCULAR | Status: AC
  Administered 2015-11-20: 60 mg via INTRAMUSCULAR
  Filled 2015-11-20: qty 2

## 2015-11-20 MED ORDER — DIAZEPAM 5 MG/ML IJ SOLN
10.0000 mg | Freq: Once | INTRAMUSCULAR | Status: AC
  Administered 2015-11-20: 10 mg via INTRAMUSCULAR
  Filled 2015-11-20: qty 2

## 2015-11-20 NOTE — ED Provider Notes (Signed)
CSN: [id number]     Arrival date & time 11/20/15  0029 History  By signing my name below, I, Evon Slackerrance Branch, attest that this documentation has been prepared under the direction and in the presence of Mancel BaleElliott Madge Therrien, MD. Electronically Signed: Evon Slackerrance Branch, ED Scribe. 11/20/2015. 12:41 AM.     Chief Complaint  Patient presents with  . Back Pain   The history is provided by the patient. No language interpreter was used.   HPI Comments: Latoya Brady is a 44 y.o. female who presents to the Emergency Department complaining of chronic back pain after recently having surgery 4 days prior. Pt states that she has recently had back surgery and was recently discharged today. Pt has had surgery in the L5 S1 region for recurrent ruptured disc. Pt states that she has tried Percocet and valium with no relief. Pt states that today her pain was worse after each time she wakes up. She describes the pain when she wakes up as a cramping sensation. She states that she is able to ambulate slowly with walker. Pt denies vomiting or fever.      Past Medical History  Diagnosis Date  . Hypothyroidism    Past Surgical History  Procedure Laterality Date  . Abdominal hysterectomy    . Back surgery    . Carpal tunnel release Bilateral    No family history on file. Social History  Substance Use Topics  . Smoking status: Current Every Day Smoker -- 0.50 packs/day for 25 years    Types: Cigarettes  . Smokeless tobacco: None  . Alcohol Use: No   OB History    No data available     Review of Systems  Constitutional: Negative for fever.  Gastrointestinal: Negative for vomiting.  Musculoskeletal: Positive for back pain.  All other systems reviewed and are negative.     Allergies  Macrobid  Home Medications   Prior to Admission medications   Medication Sig Start Date End Date Taking? Authorizing Provider  gabapentin (NEURONTIN) 300 MG capsule Take 300 mg by mouth 3 (three) times daily.     Historical Provider, MD  levothyroxine (SYNTHROID, LEVOTHROID) 75 MCG tablet Take 75 mcg by mouth daily before breakfast.    Historical Provider, MD  traMADol (ULTRAM) 50 MG tablet Take 50 mg by mouth every 6 (six) hours as needed for moderate pain.    Historical Provider, MD   BP 109/66 mmHg  Pulse 133  Temp(Src) 98.4 F (36.9 C) (Oral)  Resp 16  SpO2 98%   Physical Exam  Constitutional: She is oriented to person, place, and time. She appears well-developed and well-nourished.  HENT:  Head: Normocephalic and atraumatic.  Eyes: Conjunctivae and EOM are normal. Pupils are equal, round, and reactive to light.  Neck: Normal range of motion and phonation normal. Neck supple.  Cardiovascular: Normal rate and regular rhythm.   Pulmonary/Chest: Effort normal and breath sounds normal. She exhibits no tenderness.  Abdominal: Soft. There is no tenderness.  Musculoskeletal: Normal range of motion. She exhibits tenderness.  Mid lumbar incision with steri strips in place. No swelling or drainage around wound. Mild tenderness around the wound. Pt is standing supporting herself with walker during the exam.   Neurological: She is alert and oriented to person, place, and time. She exhibits normal muscle tone.  Skin: Skin is warm and dry.  Psychiatric: She has a normal mood and affect. Her behavior is normal. Judgment and thought content normal.  Nursing note and vitals reviewed.  ED Course  Procedures (including critical care time) DIAGNOSTIC STUDIES: Oxygen Saturation is 98% on RA, normal by my interpretation.    COORDINATION OF CARE: 12:55 AM-Discussed treatment plan with pt at bedside and pt agreed to plan.   Medications  ketorolac (TORADOL) injection 60 mg (60 mg Intramuscular Given 11/20/15 0127)  HYDROmorphone (DILAUDID) injection 2 mg (2 mg Intramuscular Given 11/20/15 0132)  ondansetron (ZOFRAN-ODT) disintegrating tablet 8 mg (8 mg Oral Given 11/20/15 0119)  diazepam (VALIUM)  injection 10 mg (10 mg Intramuscular Given 11/20/15 0128)    Patient Vitals for the past 24 hrs:  BP Temp Temp src Pulse Resp SpO2  11/20/15 0234 106/76 mmHg - - 95 16 100 %  11/20/15 0208 (!) 101/53 mmHg - - 98 15 95 %  11/20/15 0147 95/61 mmHg - - 100 16 95 %  11/20/15 0130 101/70 mmHg - - 103 - 98 %  11/20/15 0038 109/66 mmHg 98.4 F (36.9 C) Oral (!) 133 16 98 %    3:07 AM Reevaluation with update and discussion. After initial assessment and treatment, an updated evaluation reveals she is more comfortable at this time. Findings discussed with patient and husband, all questions were answered. Latoya Brady    Labs Review Labs Reviewed - No data to display  Imaging Review No results found.    EKG Interpretation None      MDM   Final diagnoses:  Midline low back pain without sciatica  Visit for wound check   Low back pain secondary to recent surgery. No evidence for frank infection, or new neurologic abnormality. Wound appears to be healing, and she likely has musculoskeletal discomfort secondary to the recent surgery.  Nursing Notes Reviewed/ Care Coordinated Applicable Imaging Reviewed Interpretation of Laboratory Data incorporated into ED treatment  The patient appears reasonably screened and/or stabilized for discharge and I doubt any other medical condition or other [facility] requiring further screening, evaluation, or treatment in the ED at this time prior to discharge.  Plan: Home Medications- double up on narcotic and muscle relaxer and add IBU TID; Home Treatments- rest, heat; return here if the recommended treatment, does not improve the symptoms; Recommended follow up- Neurosurg. prn     I personally performed the services described in this documentation, which was scribed in my presence. The recorded information has been reviewed and is accurate.      Mancel Bale, MD 11/20/15 [phone]

## 2015-11-20 NOTE — ED Notes (Addendum)
[facility].  States pain is uncontrolled at home with meds. Pt ambulatory with walker.

## 2015-11-20 NOTE — Discharge Instructions (Signed)
Rest. Use heat on the sore area 3-4 times a day. Take Ibuprofen 400 mg three times a day with meals for 1 week. It is safe to double up on the Oxycodone to  every 4 hours, and the Valium to 10 mg every 8 hours to help your discomfort.   Back Pain, Adult Back pain is very common in adults.The cause of back pain is rarely dangerous and the pain often gets better over time.The cause of your back pain may not be known. Some common causes of back pain include:  Strain of the muscles or ligaments supporting the spine.  Wear and tear (degeneration) of the spinal disks.  Arthritis.  Direct injury to the back. For many people, back pain may return. Since back pain is rarely dangerous, most people can learn to manage this condition on their own. HOME CARE INSTRUCTIONS Watch your back pain for any changes. The following actions may help to lessen any discomfort you are feeling:  Remain active. It is stressful on your back to sit or stand in one place for long periods of time. Do not sit, drive, or stand in one place for more than 30 minutes at a time. Take short walks on even surfaces as soon as you are able.Try to increase the length of time you walk each day.  Exercise regularly as directed by your health care provider. Exercise helps your back heal faster. It also helps avoid future injury by keeping your muscles strong and flexible.  Do not stay in bed.Resting more than 1-2 days can delay your recovery.  Pay attention to your body when you bend and lift. The most comfortable positions are those that put less stress on your recovering back. Always use proper lifting techniques, including:  Bending your knees.  Keeping the load close to your body.  Avoiding twisting.  Find a comfortable position to sleep. Use a firm mattress and lie on your side with your knees slightly bent. If you lie on your back, put a pillow under your knees.  Avoid feeling anxious or stressed.Stress  increases muscle tension and can worsen back pain.It is important to recognize when you are anxious or stressed and learn ways to manage it, such as with exercise.  Take medicines only as directed by your health care provider. Over-the-counter medicines to reduce pain and inflammation are often the most helpful.Your health care provider may prescribe muscle relaxant drugs.These medicines help dull your pain so you can more quickly return to your normal activities and healthy exercise.  Apply ice to the injured area:  Put ice in a plastic bag.  Place a towel between your skin and the bag.  Leave the ice on for 20 minutes, 2-3 times a day for the first 2-3 days. After that, ice and heat may be alternated to reduce pain and spasms.  Maintain a healthy weight. Excess weight puts extra stress on your back and makes it difficult to maintain good posture. SEEK MEDICAL CARE IF:  You have pain that is not relieved with rest or medicine.  You have increasing pain going down into the legs or buttocks.  You have pain that does not improve in one week.  You have night pain.  You lose weight.  You have a fever or chills. SEEK IMMEDIATE MEDICAL CARE IF:   You develop new bowel or bladder control problems.  You have unusual weakness or numbness in your arms or legs.  You develop nausea or vomiting.  You develop abdominal  pain.  You feel faint.   Wound Care Taking care of your wound properly can help to prevent pain and infection. It can also help your wound to heal more quickly.  HOW TO CARE FOR YOUR WOUND  Take or apply over-the-counter and prescription medicines only as told by your health care provider.  If you were prescribed antibiotic medicine, take or apply it as told by your health care provider. Do not stop using the antibiotic even if your condition improves.  Clean the wound each day or as told by your health care provider.  Wash the wound with mild soap and  water.  Rinse the wound with water to remove all soap.  Pat the wound dry with a clean towel. Do not rub it.  There are many different ways to close and cover a wound. For example, a wound can be covered with stitches (sutures), skin glue, or adhesive strips. Follow instructions from your health care provider about:  How to take care of your wound.  When and how you should change your bandage (dressing).  When you should remove your dressing.  Removing whatever was used to close your wound.  Check your wound every day for signs of infection. Watch for:  Redness, swelling, or pain.  Fluid, blood, or pus.  Keep the dressing dry until your health care provider says it can be removed. Do not take baths, swim, use a hot tub, or do anything that would put your wound underwater until your health care provider approves.  Raise (elevate) the injured area above the level of your heart while you are sitting or lying down.  Do not scratch or pick at the wound.  Keep all follow-up visits as told by your health care provider. This is important. SEEK MEDICAL CARE IF:  You received a tetanus shot and you have swelling, severe pain, redness, or bleeding at the injection site.  You have a fever.  Your pain is not controlled with medicine.  You have increased redness, swelling, or pain at the site of your wound.  You have fluid, blood, or pus coming from your wound.  You notice a bad smell coming from your wound or your dressing. SEEK IMMEDIATE MEDICAL CARE IF:  You have a red streak going away from your wound.   This information is not intended to replace advice given to you by your health care provider. Make sure you discuss any questions you have with your health care provider.   Document Released: 09/05/2008 Document Revised: 04/13/2015 Document Reviewed: 11/23/2014 Elsevier Interactive Patient Education [employer]  This information is not intended to replace advice given  to you by your health care provider. Make sure you discuss any questions you have with your health care provider.   Document Released: 11/27/2005 Document Revised: 12/18/2014 Document Reviewed: 03/31/2014 Elsevier Interactive Patient Education [employer].

## 2015-11-21 LAB — ANAEROBIC CULTURE: GRAM STAIN: NONE SEEN

## 2015-12-09 ENCOUNTER — Other Ambulatory Visit: Payer: Self-pay | Admitting: Neurosurgery

## 2015-12-09 DIAGNOSIS — M5416 Radiculopathy, lumbar region: Secondary | ICD-10-CM

## 2015-12-10 ENCOUNTER — Encounter (HOSPITAL_COMMUNITY): Payer: Self-pay | Admitting: Emergency Medicine

## 2015-12-10 ENCOUNTER — Ambulatory Visit
Admission: RE | Admit: 2015-12-10 | Discharge: 2015-12-10 | Disposition: A | Discharge status: Home / Self Care | Payer: 59 | Source: Ambulatory Visit | Attending: Neurosurgery | Admitting: Neurosurgery

## 2015-12-10 ENCOUNTER — Inpatient Hospital Stay (HOSPITAL_COMMUNITY)
Admission: EM | Admit: 2015-12-10 | Discharge: 2015-12-13 | DRG: 909 | Disposition: A | Discharge status: Home / Self Care | Payer: 59 | Attending: Neurosurgery | Admitting: Neurosurgery

## 2015-12-10 ENCOUNTER — Other Ambulatory Visit: Payer: Self-pay | Admitting: Neurosurgery

## 2015-12-10 DIAGNOSIS — M96842 Postprocedural seroma of a musculoskeletal structure following a musculoskeletal system procedure: Secondary | ICD-10-CM | POA: Diagnosis not present

## 2015-12-10 DIAGNOSIS — Z419 Encounter for procedure for purposes other than remedying health state, unspecified: Secondary | ICD-10-CM

## 2015-12-10 DIAGNOSIS — M545 Low back pain, unspecified: Secondary | ICD-10-CM

## 2015-12-10 DIAGNOSIS — Y838 Other surgical procedures as the cause of abnormal reaction of the patient, or of later complication, without mention of misadventure at the time of the procedure: Secondary | ICD-10-CM | POA: Diagnosis present

## 2015-12-10 DIAGNOSIS — M5416 Radiculopathy, lumbar region: Secondary | ICD-10-CM

## 2015-12-10 DIAGNOSIS — Z79899 Other long term (current) drug therapy: Secondary | ICD-10-CM

## 2015-12-10 DIAGNOSIS — E039 Hypothyroidism, unspecified: Secondary | ICD-10-CM | POA: Diagnosis present

## 2015-12-10 DIAGNOSIS — Z981 Arthrodesis status: Secondary | ICD-10-CM

## 2015-12-10 HISTORY — DX: Dorsalgia, unspecified: M54.9

## 2015-12-10 HISTORY — DX: Other chronic pain: G89.29

## 2015-12-10 HISTORY — DX: Low back pain, unspecified: M54.50

## 2015-12-10 LAB — CBC WITH DIFFERENTIAL/PLATELET
BASOS PCT: 1 %
Basophils Absolute: 0.1 10*3/uL (ref 0.0–0.1)
Eosinophils Absolute: 0.4 10*3/uL (ref 0.0–0.7)
Eosinophils Relative: 5 %
HEMATOCRIT: 33.9 % — AB (ref 36.0–46.0)
Hemoglobin: 11.5 g/dL — ABNORMAL LOW (ref 12.0–15.0)
LYMPHS ABS: 2.1 10*3/uL (ref 0.7–4.0)
Lymphocytes Relative: 31 %
MCH: 29.9 pg (ref 26.0–34.0)
MCHC: 33.9 g/dL (ref 30.0–36.0)
MCV: 88.3 fL (ref 78.0–100.0)
MONO ABS: 0.4 10*3/uL (ref 0.1–1.0)
MONOS PCT: 6 %
NEUTROS ABS: 3.9 10*3/uL (ref 1.7–7.7)
Neutrophils Relative %: 57 %
Platelets: 301 10*3/uL (ref 150–400)
RBC: 3.84 MIL/uL — ABNORMAL LOW (ref 3.87–5.11)
RDW: 13.3 % (ref 11.5–15.5)
WBC: 6.8 10*3/uL (ref 4.0–10.5)

## 2015-12-10 LAB — COMPREHENSIVE METABOLIC PANEL
ALBUMIN: 3.9 g/dL (ref 3.5–5.0)
ALK PHOS: 57 U/L (ref 38–126)
ALT: 11 U/L — AB (ref 14–54)
ANION GAP: 9 (ref 5–15)
AST: 19 U/L (ref 15–41)
BUN: 7 mg/dL (ref 6–20)
CALCIUM: 9.6 mg/dL (ref 8.9–10.3)
CHLORIDE: 109 mmol/L (ref 101–111)
CO2: 25 mmol/L (ref 22–32)
CREATININE: 0.74 mg/dL (ref 0.44–1.00)
GFR calc Af Amer: >60 mL/min (ref >60)
GFR calc non Af Amer: >60 mL/min (ref >60)
GLUCOSE: 100 mg/dL — AB (ref 65–99)
Potassium: 3.9 mmol/L (ref 3.5–5.1)
SODIUM: 143 mmol/L (ref 135–145)
Total Bilirubin: 0.8 mg/dL (ref 0.3–1.2)
Total Protein: 6.7 g/dL (ref 6.5–8.1)

## 2015-12-10 SURGERY — Surgical Case
Anesthesia: *Unknown

## 2015-12-10 MED ORDER — DIPHENHYDRAMINE HCL 12.5 MG/5ML PO ELIX: 12.5000 mg | ORAL_SOLUTION | Freq: Four times a day (QID) | ORAL | Status: DC | PRN

## 2015-12-10 MED ORDER — ONDANSETRON HCL 4 MG/2ML IJ SOLN: 4.0000 mg | Freq: Four times a day (QID) | INTRAMUSCULAR | Status: DC | PRN

## 2015-12-10 MED ORDER — HYDROMORPHONE HCL 2 MG/ML IJ SOLN
4.0000 mg | Freq: Once | INTRAMUSCULAR | Status: AC
  Administered 2015-12-10: 4 mg via INTRAMUSCULAR

## 2015-12-10 MED ORDER — HYDROMORPHONE 1 MG/ML IV SOLN
INTRAVENOUS | Status: DC
  Administered 2015-12-10: 25 mL via INTRAVENOUS
  Administered 2015-12-11: 3.9 mg via INTRAVENOUS
  Administered 2015-12-11: 12:00:00 via INTRAVENOUS
  Administered 2015-12-11: 0.73 mg via INTRAVENOUS
  Administered 2015-12-11: 1.6 mg via INTRAVENOUS
  Filled 2015-12-10: qty 25

## 2015-12-10 MED ORDER — NALOXONE HCL 0.4 MG/ML IJ SOLN: 0.4000 mg | INTRAMUSCULAR | Status: DC | PRN

## 2015-12-10 MED ORDER — SODIUM CHLORIDE 0.9 % IJ SOLN: 9.0000 mL | INTRAMUSCULAR | Status: DC | PRN

## 2015-12-10 MED ORDER — OXYCODONE-ACETAMINOPHEN 5-325 MG PO TABS
ORAL_TABLET | ORAL | Status: AC
  Filled 2015-12-10: qty 1

## 2015-12-10 MED ORDER — GABAPENTIN 300 MG PO CAPS
300.0000 mg | ORAL_CAPSULE | Freq: Three times a day (TID) | ORAL | Status: DC
  Administered 2015-12-10: 300 mg via ORAL
  Filled 2015-12-10: qty 1

## 2015-12-10 MED ORDER — DIPHENHYDRAMINE HCL 50 MG/ML IJ SOLN: 12.5000 mg | Freq: Four times a day (QID) | INTRAMUSCULAR | Status: DC | PRN

## 2015-12-10 MED ORDER — LEVOTHYROXINE SODIUM 50 MCG PO TABS: 50.0000 ug | ORAL_TABLET | Freq: Every day | ORAL | Status: DC

## 2015-12-10 MED ORDER — OXYCODONE-ACETAMINOPHEN 5-325 MG PO TABS
1.0000 | ORAL_TABLET | Freq: Once | ORAL | Status: AC
  Administered 2015-12-10: 1 via ORAL

## 2015-12-10 NOTE — ED Notes (Signed)
Provider to call OR [phone] to speak with Dr Jeral FruitBotero.

## 2015-12-10 NOTE — Progress Notes (Signed)
Patient arrived to 5M09 at 1720 from ED. Vital signs taken at charted. O2 and IVF. Oriented to room with bed alarm on . Family at bedside.

## 2015-12-10 NOTE — ED Notes (Signed)
Pt from home for eval of lower back pain following back surgery on 11/26/15. Pt states had fluid drained from back today and states pain has gotten worse. No reddness noted to incision site however site noted to be warm to touch. nad noted, pt tearful. Denies any n/v/d or fevers. Pt with home walker.

## 2015-12-10 NOTE — H&P (Signed)
Latoya Brady is an 44 y.o. female.   Chief Complaint: right leg pain HPI: patient who about 4 weeks ago had a l5s1 fusion for recurrent hnp. Went home and since then she has been seen in my office with right pain which got worse yesterday.mri lumbar was done an d a seroma was found. She was advised to be admitted and we optioned for an aspiration which was dine this am. She came to the er with worsening of the pain and admitted  Past Medical History  Diagnosis Date  . Hypothyroidism   . Chronic back pain     Past Surgical History  Procedure Laterality Date  . Abdominal hysterectomy    . Back surgery    . Carpal tunnel release Bilateral     History reviewed. No pertinent family history. Social History:  reports that she has been smoking Cigarettes.  She has a 12.5 pack-year smoking history. She has never used smokeless tobacco. She reports that she does not drink alcohol or use illicit drugs.  Allergies:  Allergies  Allergen Reactions  . Macrobid [Nitrofurantoin [employer] like I had pneumonia    Medications Prior to Admission  Medication Sig Dispense Refill  . diazepam (VALIUM) 5 MG tablet Take 2.5 mg by mouth every 6 (six) hours as needed for anxiety.     . docusate sodium (COLACE) 100 MG capsule Take 100 mg by mouth daily as needed for mild constipation.    . gabapentin (NEURONTIN) 300 MG capsule Take 300 mg by mouth 3 (three) times daily.    Marland Kitchen levothyroxine (SYNTHROID, LEVOTHROID) 75 MCG tablet Take 75 mcg by mouth daily before breakfast.    . oxyCODONE (OXY IR/ROXICODONE) 5 MG immediate release tablet Take 2.5-5 mg by mouth every 4 (four) hours as needed for severe pain.     . polyethylene glycol (MIRALAX / GLYCOLAX) packet Take 17 g by mouth daily as needed for moderate constipation.    . traMADol (ULTRAM) 50 MG tablet Take 50 mg by mouth every 6 (six) hours as needed for moderate pain.      Results for orders placed or performed during the hospital  encounter of 12/10/15 (from the past 48 hour(s))  CBC with Differential/Platelet     Status: Abnormal   Collection Time: 12/10/15  8:04 PM  Result Value Ref Range   WBC 6.8 4.0 - 10.5 K/uL   RBC 3.84 (L) 3.87 - 5.11 MIL/uL   Hemoglobin 11.5 (L) 12.0 - 15.0 g/dL   HCT 45.4 (L) 09.8 - 11.9 %   MCV 88.3 78.0 - 100.0 fL   MCH 29.9 26.0 - 34.0 pg   MCHC 33.9 30.0 - 36.0 g/dL   RDW 14.7 82.9 - 56.2 %   Platelets 301 150 - 400 K/uL   Neutrophils Relative % 57 %   Neutro Abs 3.9 1.7 - 7.7 K/uL   Lymphocytes Relative 31 %   Lymphs Abs 2.1 0.7 - 4.0 K/uL   Monocytes Relative 6 %   Monocytes Absolute 0.4 0.1 - 1.0 K/uL   Eosinophils Relative 5 %   Eosinophils Absolute 0.4 0.0 - 0.7 K/uL   Basophils Relative 1 %   Basophils Absolute 0.1 0.0 - 0.1 K/uL   Dg Fluoro Guide Ndl Plcd/bx/inj/loc  12/10/2015  CLINICAL DATA:  Postoperative fluid collection of uncertain nature. Previous L5-S1 fusion. FLUOROSCOPY TIME:  13 seconds corresponding to a Dose Area Product of 27.5 Gy*m2 PROCEDURE: ASPIRATION OF A POSTOPERATIVE SEROMA BETWEEN  THE L5 AND S1 PEDICLE SCREWS An appropriate skin entrance site was determined. The site was marked, prepped with Betadine, draped in the usual sterile fashion, and infiltrated locally with 1% lidocaine. 18 gauge spinal needle was advanced into the expected location of the seroma using fluoroscopic guidance. Initially a 20 gauge needle was placed but this did not provide good return of flow. Using gentle aspiration, I was able to retrieve 7 mL of iced tea colored fluid, which did NOT appear purulent, thick, or resemble CSF. The findings are consistent with a postoperative seroma. At the end of the procedure, specimen was sent for culture and the patient was discharged in good condition, with improved RIGHT leg pain. IMPRESSION: Technically successful aspiration of a postoperative seroma following L5 and S1 posterior and interbody fusion. Electronically Signed   By: Elsie StainJohn T Curnes  M.D.   On: 12/10/2015 10:13    Review of Systems  Constitutional: Negative.   HENT: Negative.   Respiratory: Negative.   Cardiovascular: Negative.   Gastrointestinal: Negative.   Genitourinary: Negative.   Musculoskeletal: Positive for back pain.  Skin: Negative.   Neurological: Positive for sensory change and focal weakness.  Endo/Heme/Allergies: Negative.   Psychiatric/Behavioral: Negative.     Blood pressure 122/56, pulse 79, temperature 98 F (36.7 C), temperature source Oral, resp. rate 20, height 5\' 2"  (1.575 m), weight 64.864 kg (143 lb), SpO2 100 %. Physical Exam  Sleepy post analgesia. Hent, nl. Neck, no stiffness. Cv, nl. Lungs, clear. Abdomen nl. Extremities, nl. Neuro burning pain at l5-s1 dermatomes. Mild weakness of df pf right foot Assessment/Plan Pain control and if no better to or in am for evacuation ft the seroma  Burle Kwan M 12/10/2015, 8:41 PM

## 2015-12-10 NOTE — Progress Notes (Signed)
Patient ID: Latoya Brady, female   DOB: [birth year], 44 y.o.   MRN: [id number] Resting, to or in am

## 2015-12-10 NOTE — Progress Notes (Signed)
Patient ID: Latoya Brady, female   DOB: [birth year], 44 y.o.   MRN: [id number] Spoke with husband. Aware and agrees with the plan

## 2015-12-11 ENCOUNTER — Observation Stay (HOSPITAL_COMMUNITY): Payer: 59

## 2015-12-11 ENCOUNTER — Encounter (HOSPITAL_COMMUNITY)
Admission: EM | Disposition: A | Discharge status: Home / Self Care | Payer: Self-pay | Source: Home / Self Care | Attending: Neurosurgery

## 2015-12-11 ENCOUNTER — Observation Stay (HOSPITAL_COMMUNITY): Payer: 59 | Admitting: Anesthesiology

## 2015-12-11 DIAGNOSIS — E039 Hypothyroidism, unspecified: Secondary | ICD-10-CM | POA: Diagnosis present

## 2015-12-11 DIAGNOSIS — M545 Low back pain: Secondary | ICD-10-CM | POA: Diagnosis present

## 2015-12-11 DIAGNOSIS — Y838 Other surgical procedures as the cause of abnormal reaction of the patient, or of later complication, without mention of misadventure at the time of the procedure: Secondary | ICD-10-CM | POA: Diagnosis present

## 2015-12-11 DIAGNOSIS — M96842 Postprocedural seroma of a musculoskeletal structure following a musculoskeletal system procedure: Secondary | ICD-10-CM | POA: Diagnosis present

## 2015-12-11 DIAGNOSIS — M5416 Radiculopathy, lumbar region: Secondary | ICD-10-CM | POA: Diagnosis present

## 2015-12-11 DIAGNOSIS — Z981 Arthrodesis status: Secondary | ICD-10-CM | POA: Diagnosis not present

## 2015-12-11 DIAGNOSIS — Z79899 Other long term (current) drug therapy: Secondary | ICD-10-CM | POA: Diagnosis not present

## 2015-12-11 HISTORY — DX: Radiculopathy, lumbar region: M54.16

## 2015-12-11 HISTORY — PX: LUMBAR WOUND DEBRIDEMENT: SHX1988

## 2015-12-11 SURGERY — LUMBAR WOUND DEBRIDEMENT
Anesthesia: General | Site: Back

## 2015-12-11 MED ORDER — NEOSTIGMINE METHYLSULFATE 10 MG/10ML IV SOLN
INTRAVENOUS | Status: DC | PRN
Start: 2015-12-11 — End: 2015-12-11
  Administered 2015-12-11: 3 mg via INTRAVENOUS

## 2015-12-11 MED ORDER — SODIUM CHLORIDE 0.9 % IJ SOLN
INTRAMUSCULAR | Status: AC
  Filled 2015-12-11: qty 10

## 2015-12-11 MED ORDER — SUCCINYLCHOLINE CHLORIDE 20 MG/ML IJ SOLN
INTRAMUSCULAR | Status: AC
  Filled 2015-12-11: qty 1

## 2015-12-11 MED ORDER — BUPIVACAINE LIPOSOME 1.3 % IJ SUSP
INTRAMUSCULAR | Status: DC | PRN
  Administered 2015-12-11: 20 mL

## 2015-12-11 MED ORDER — ARTIFICIAL TEARS OP OINT
TOPICAL_OINTMENT | OPHTHALMIC | Status: DC | PRN
  Administered 2015-12-11: 1 via OPHTHALMIC

## 2015-12-11 MED ORDER — PROMETHAZINE HCL 25 MG/ML IJ SOLN: 6.2500 mg | INTRAMUSCULAR | Status: DC | PRN

## 2015-12-11 MED ORDER — ONDANSETRON HCL 4 MG/2ML IJ SOLN
INTRAMUSCULAR | Status: DC | PRN
  Administered 2015-12-11: 4 mg via INTRAVENOUS

## 2015-12-11 MED ORDER — ROCURONIUM BROMIDE 50 MG/5ML IV SOLN
INTRAVENOUS | Status: AC
  Filled 2015-12-11: qty 1

## 2015-12-11 MED ORDER — SODIUM CHLORIDE 0.9 % IV SOLN
INTRAVENOUS | Status: AC
  Administered 2015-12-11: 13:00:00 via INTRAVENOUS

## 2015-12-11 MED ORDER — LORAZEPAM 2 MG/ML IJ SOLN: 1.0000 mg | Freq: Once | INTRAMUSCULAR | Status: DC | PRN

## 2015-12-11 MED ORDER — GLYCOPYRROLATE 0.2 MG/ML IJ SOLN
INTRAMUSCULAR | Status: AC
  Filled 2015-12-11: qty 2

## 2015-12-11 MED ORDER — ONDANSETRON HCL 4 MG/2ML IJ SOLN
INTRAMUSCULAR | Status: AC
  Filled 2015-12-11: qty 2

## 2015-12-11 MED ORDER — VANCOMYCIN HCL 1000 MG IV SOLR
INTRAVENOUS | Status: AC
  Filled 2015-12-11: qty 1000

## 2015-12-11 MED ORDER — LEVOTHYROXINE SODIUM 75 MCG PO TABS
75.0000 ug | ORAL_TABLET | Freq: Every day | ORAL | Status: DC
  Administered 2015-12-12 – 2015-12-13 (×2): 75 ug via ORAL
  Filled 2015-12-11: qty 1
  Filled 2015-12-11: qty 3

## 2015-12-11 MED ORDER — CEFAZOLIN SODIUM 1-5 GM-% IV SOLN
1.0000 g | Freq: Three times a day (TID) | INTRAVENOUS | Status: AC
  Administered 2015-12-11 – 2015-12-12 (×2): 1 g via INTRAVENOUS
  Filled 2015-12-11 (×2): qty 50

## 2015-12-11 MED ORDER — SODIUM CHLORIDE 0.9 % IJ SOLN: 9.0000 mL | INTRAMUSCULAR | Status: DC | PRN

## 2015-12-11 MED ORDER — STERILE WATER FOR INJECTION IJ SOLN
INTRAMUSCULAR | Status: AC
  Filled 2015-12-11: qty 20

## 2015-12-11 MED ORDER — ZOLPIDEM TARTRATE 5 MG PO TABS: 5.0000 mg | ORAL_TABLET | Freq: Every evening | ORAL | Status: DC | PRN

## 2015-12-11 MED ORDER — PROPOFOL 10 MG/ML IV BOLUS
INTRAVENOUS | Status: DC | PRN
  Administered 2015-12-11: 150 mg via INTRAVENOUS

## 2015-12-11 MED ORDER — LIDOCAINE HCL (CARDIAC) 20 MG/ML IV SOLN
INTRAVENOUS | Status: DC | PRN
  Administered 2015-12-11: 60 mg via INTRAVENOUS

## 2015-12-11 MED ORDER — OXYCODONE-ACETAMINOPHEN 5-325 MG PO TABS
1.0000 | ORAL_TABLET | ORAL | Status: DC | PRN
  Administered 2015-12-12 – 2015-12-13 (×7): 2 via ORAL
  Filled 2015-12-11: qty 1
  Filled 2015-12-11 (×7): qty 2

## 2015-12-11 MED ORDER — PHENOL 1.4 % MT LIQD: 1.0000 | OROMUCOSAL | Status: DC | PRN

## 2015-12-11 MED ORDER — FENTANYL CITRATE (PF) 100 MCG/2ML IJ SOLN
INTRAMUSCULAR | Status: DC | PRN
  Administered 2015-12-11 (×3): 50 ug via INTRAVENOUS
  Administered 2015-12-11: 25 ug via INTRAVENOUS

## 2015-12-11 MED ORDER — HYDROMORPHONE 1 MG/ML IV SOLN
INTRAVENOUS | Status: DC
Start: 2015-12-11 — End: 2015-12-12
  Administered 2015-12-11: 2.7 mg via INTRAVENOUS
  Administered 2015-12-11: 1.8 mg via INTRAVENOUS
  Administered 2015-12-11: 2.7 mg via INTRAVENOUS
  Administered 2015-12-12: 3 mg via INTRAVENOUS
  Administered 2015-12-12: 3.3 mg via INTRAVENOUS

## 2015-12-11 MED ORDER — ONDANSETRON HCL 4 MG/2ML IJ SOLN: 4.0000 mg | INTRAMUSCULAR | Status: DC | PRN

## 2015-12-11 MED ORDER — LACTATED RINGERS IV SOLN
INTRAVENOUS | Status: DC | PRN
  Administered 2015-12-11: 09:00:00 via INTRAVENOUS

## 2015-12-11 MED ORDER — ONDANSETRON HCL 4 MG/2ML IJ SOLN: 4.0000 mg | Freq: Three times a day (TID) | INTRAMUSCULAR | Status: DC | PRN

## 2015-12-11 MED ORDER — DIPHENHYDRAMINE HCL 12.5 MG/5ML PO ELIX: 12.5000 mg | ORAL_SOLUTION | Freq: Four times a day (QID) | ORAL | Status: DC | PRN

## 2015-12-11 MED ORDER — MIDAZOLAM HCL 2 MG/2ML IJ SOLN
INTRAMUSCULAR | Status: AC
  Filled 2015-12-11: qty 2

## 2015-12-11 MED ORDER — ACETAMINOPHEN 325 MG PO TABS: 650.0000 mg | ORAL_TABLET | ORAL | Status: DC | PRN

## 2015-12-11 MED ORDER — BUPIVACAINE LIPOSOME 1.3 % IJ SUSP
20.0000 mL | INTRAMUSCULAR | Status: AC
  Filled 2015-12-11 (×2): qty 20

## 2015-12-11 MED ORDER — ROCURONIUM BROMIDE 100 MG/10ML IV SOLN
INTRAVENOUS | Status: DC | PRN
  Administered 2015-12-11: 30 mg via INTRAVENOUS

## 2015-12-11 MED ORDER — ACETAMINOPHEN 650 MG RE SUPP: 650.0000 mg | RECTAL | Status: DC | PRN

## 2015-12-11 MED ORDER — LIDOCAINE HCL (CARDIAC) 20 MG/ML IV SOLN
INTRAVENOUS | Status: AC
  Filled 2015-12-11: qty 5

## 2015-12-11 MED ORDER — HYDROMORPHONE 1 MG/ML IV SOLN
INTRAVENOUS | Status: AC
  Filled 2015-12-11: qty 25

## 2015-12-11 MED ORDER — DIPHENHYDRAMINE HCL 50 MG/ML IJ SOLN: 12.5000 mg | Freq: Four times a day (QID) | INTRAMUSCULAR | Status: DC | PRN

## 2015-12-11 MED ORDER — FENTANYL CITRATE (PF) 250 MCG/5ML IJ SOLN
INTRAMUSCULAR | Status: AC
  Filled 2015-12-11: qty 5

## 2015-12-11 MED ORDER — FENTANYL CITRATE (PF) 100 MCG/2ML IJ SOLN
100.0000 ug | INTRAMUSCULAR | Status: AC | PRN
  Administered 2015-12-11 – 2015-12-12 (×4): 100 ug via INTRAVENOUS
  Filled 2015-12-11 (×4): qty 2

## 2015-12-11 MED ORDER — FENTANYL CITRATE (PF) 100 MCG/2ML IJ SOLN: 25.0000 ug | INTRAMUSCULAR | Status: DC | PRN

## 2015-12-11 MED ORDER — ONDANSETRON HCL 4 MG/2ML IJ SOLN: 4.0000 mg | Freq: Four times a day (QID) | INTRAMUSCULAR | Status: DC | PRN

## 2015-12-11 MED ORDER — GABAPENTIN 300 MG PO CAPS
300.0000 mg | ORAL_CAPSULE | Freq: Three times a day (TID) | ORAL | Status: DC
  Administered 2015-12-11 – 2015-12-13 (×6): 300 mg via ORAL
  Filled 2015-12-11 (×6): qty 1

## 2015-12-11 MED ORDER — ARTIFICIAL TEARS OP OINT
TOPICAL_OINTMENT | OPHTHALMIC | Status: AC
Start: 2015-12-11 — End: 2015-12-11
  Filled 2015-12-11: qty 3.5

## 2015-12-11 MED ORDER — OXYCODONE HCL 5 MG PO TABS: 5.0000 mg | ORAL_TABLET | Freq: Once | ORAL | Status: DC | PRN

## 2015-12-11 MED ORDER — NALOXONE HCL 0.4 MG/ML IJ SOLN: 0.4000 mg | INTRAMUSCULAR | Status: DC | PRN

## 2015-12-11 MED ORDER — SENNA 8.6 MG PO TABS
1.0000 | ORAL_TABLET | Freq: Two times a day (BID) | ORAL | Status: DC
  Administered 2015-12-11 – 2015-12-13 (×5): 8.6 mg via ORAL
  Filled 2015-12-11 (×5): qty 1

## 2015-12-11 MED ORDER — SODIUM CHLORIDE 0.9 % IJ SOLN
3.0000 mL | Freq: Two times a day (BID) | INTRAMUSCULAR | Status: DC
  Administered 2015-12-12 – 2015-12-13 (×3): 3 mL via INTRAVENOUS

## 2015-12-11 MED ORDER — MENTHOL 3 MG MT LOZG: 1.0000 | LOZENGE | OROMUCOSAL | Status: DC | PRN

## 2015-12-11 MED ORDER — VANCOMYCIN HCL 1000 MG IV SOLR
INTRAVENOUS | Status: DC | PRN
  Administered 2015-12-11: 1000 mg

## 2015-12-11 MED ORDER — OXYCODONE HCL 5 MG/5ML PO SOLN: 5.0000 mg | Freq: Once | ORAL | Status: DC | PRN

## 2015-12-11 MED ORDER — PROPOFOL 10 MG/ML IV BOLUS
INTRAVENOUS | Status: AC
  Filled 2015-12-11: qty 20

## 2015-12-11 MED ORDER — GLYCOPYRROLATE 0.2 MG/ML IJ SOLN
INTRAMUSCULAR | Status: DC | PRN
Start: 2015-12-11 — End: 2015-12-11
  Administered 2015-12-11: .4 mg via INTRAVENOUS

## 2015-12-11 MED ORDER — 0.9 % SODIUM CHLORIDE (POUR BTL) OPTIME
TOPICAL | Status: DC | PRN
  Administered 2015-12-11: 1000 mL

## 2015-12-11 MED ORDER — DEXTROSE 5 % IV SOLN
10.0000 mg | INTRAVENOUS | Status: DC | PRN
  Administered 2015-12-11: 15 ug/min via INTRAVENOUS

## 2015-12-11 MED ORDER — GELATIN ABSORBABLE MT POWD
OROMUCOSAL | Status: DC | PRN
  Administered 2015-12-11: 5 mL via TOPICAL

## 2015-12-11 MED ORDER — SODIUM CHLORIDE 0.9 % IV SOLN: 250.0000 mL | INTRAVENOUS | Status: DC

## 2015-12-11 MED ORDER — SODIUM CHLORIDE 0.9 % IV SOLN: INTRAVENOUS | Status: DC

## 2015-12-11 MED ORDER — CEFAZOLIN SODIUM-DEXTROSE 2-3 GM-% IV SOLR
INTRAVENOUS | Status: DC | PRN
  Administered 2015-12-11: 2 g via INTRAVENOUS

## 2015-12-11 MED ORDER — SODIUM CHLORIDE 0.9 % IJ SOLN: 3.0000 mL | INTRAMUSCULAR | Status: DC | PRN

## 2015-12-11 MED ORDER — PHENYLEPHRINE HCL 10 MG/ML IJ SOLN
INTRAMUSCULAR | Status: DC | PRN
  Administered 2015-12-11 (×2): 80 ug via INTRAVENOUS

## 2015-12-11 MED ORDER — DIAZEPAM 5 MG PO TABS
5.0000 mg | ORAL_TABLET | Freq: Four times a day (QID) | ORAL | Status: DC | PRN
  Administered 2015-12-12 – 2015-12-13 (×3): 5 mg via ORAL
  Filled 2015-12-11 (×3): qty 1

## 2015-12-11 MED ORDER — MIDAZOLAM HCL 5 MG/5ML IJ SOLN
INTRAMUSCULAR | Status: DC | PRN
  Administered 2015-12-11: 1 mg via INTRAVENOUS

## 2015-12-11 MED ORDER — THROMBIN 20000 UNITS EX SOLR
CUTANEOUS | Status: DC | PRN
  Administered 2015-12-11: 20 mL via TOPICAL

## 2015-12-11 SURGICAL SUPPLY — 60 items
ADH SKN CLS APL DERMABOND .7 (GAUZE/BANDAGES/DRESSINGS) ×1
APL SKNCLS STERI-STRIP NONHPOA (GAUZE/BANDAGES/DRESSINGS)
BENZOIN TINCTURE PRP APPL 2/3 (GAUZE/BANDAGES/DRESSINGS) IMPLANT
BLADE CLIPPER SURG (BLADE) IMPLANT
CANISTER SUCT 3000ML PPV (MISCELLANEOUS) ×3 IMPLANT
CLOSURE WOUND 1/2 X4 (GAUZE/BANDAGES/DRESSINGS)
DERMABOND ADVANCED (GAUZE/BANDAGES/DRESSINGS) ×2
DERMABOND ADVANCED .7 DNX12 (GAUZE/BANDAGES/DRESSINGS) IMPLANT
DRAPE LAPAROTOMY 100X72X124 (DRAPES) ×3 IMPLANT
DRAPE POUCH INSTRU U-SHP 10X18 (DRAPES) ×3 IMPLANT
DRSG OPSITE POSTOP 4X6 (GAUZE/BANDAGES/DRESSINGS) ×2 IMPLANT
DURAPREP 26ML APPLICATOR (WOUND CARE) IMPLANT
ELECT REM PT RETURN 9FT ADLT (ELECTROSURGICAL) ×3
ELECTRODE REM PT RTRN 9FT ADLT (ELECTROSURGICAL) ×1 IMPLANT
GAUZE SPONGE 4X4 12PLY STRL (GAUZE/BANDAGES/DRESSINGS) ×3 IMPLANT
GAUZE SPONGE 4X4 16PLY XRAY LF (GAUZE/BANDAGES/DRESSINGS) IMPLANT
GLOVE BIO SURGEON STRL SZ 6.5 (GLOVE) ×1 IMPLANT
GLOVE BIO SURGEON STRL SZ7 (GLOVE) ×2 IMPLANT
GLOVE BIO SURGEON STRL SZ7.5 (GLOVE) IMPLANT
GLOVE BIO SURGEON STRL SZ8 (GLOVE) IMPLANT
GLOVE BIO SURGEON STRL SZ8.5 (GLOVE) IMPLANT
GLOVE BIO SURGEONS STRL SZ 6.5 (GLOVE) ×1
GLOVE BIOGEL M 8.0 STRL (GLOVE) ×3 IMPLANT
GLOVE ECLIPSE 6.5 STRL STRAW (GLOVE) IMPLANT
GLOVE ECLIPSE 7.0 STRL STRAW (GLOVE) IMPLANT
GLOVE ECLIPSE 7.5 STRL STRAW (GLOVE) IMPLANT
GLOVE ECLIPSE 8.0 STRL XLNG CF (GLOVE) IMPLANT
GLOVE ECLIPSE 8.5 STRL (GLOVE) IMPLANT
GLOVE EXAM NITRILE LRG STRL (GLOVE) IMPLANT
GLOVE EXAM NITRILE MD LF STRL (GLOVE) IMPLANT
GLOVE EXAM NITRILE XL STR (GLOVE) IMPLANT
GLOVE EXAM NITRILE XS STR PU (GLOVE) IMPLANT
GLOVE INDICATOR 6.5 STRL GRN (GLOVE) IMPLANT
GLOVE INDICATOR 7.0 STRL GRN (GLOVE) IMPLANT
GLOVE INDICATOR 7.5 STRL GRN (GLOVE) IMPLANT
GLOVE INDICATOR 8.0 STRL GRN (GLOVE) IMPLANT
GLOVE INDICATOR 8.5 STRL (GLOVE) IMPLANT
GLOVE OPTIFIT SS 8.0 STRL (GLOVE) IMPLANT
GLOVE SURG SS PI 6.5 STRL IVOR (GLOVE) IMPLANT
GOWN STRL REUS W/ TWL LRG LVL3 (GOWN DISPOSABLE) ×1 IMPLANT
GOWN STRL REUS W/ TWL XL LVL3 (GOWN DISPOSABLE) IMPLANT
GOWN STRL REUS W/TWL 2XL LVL3 (GOWN DISPOSABLE) IMPLANT
GOWN STRL REUS W/TWL LRG LVL3 (GOWN DISPOSABLE) ×6
GOWN STRL REUS W/TWL XL LVL3 (GOWN DISPOSABLE)
KIT BASIN OR (CUSTOM PROCEDURE TRAY) ×3 IMPLANT
KIT ROOM TURNOVER OR (KITS) ×3 IMPLANT
NS IRRIG 1000ML POUR BTL (IV SOLUTION) ×3 IMPLANT
PACK LAMINECTOMY NEURO (CUSTOM PROCEDURE TRAY) ×3 IMPLANT
PAD ARMBOARD 7.5X6 YLW CONV (MISCELLANEOUS) ×9 IMPLANT
SPONGE SURGIFOAM ABS GEL SZ50 (HEMOSTASIS) ×3 IMPLANT
STRIP CLOSURE SKIN 1/2X4 (GAUZE/BANDAGES/DRESSINGS) IMPLANT
SUT VIC AB 0 CT1 18XCR BRD8 (SUTURE) ×1 IMPLANT
SUT VIC AB 0 CT1 8-18 (SUTURE) ×3
SUT VIC AB 2-0 CP2 18 (SUTURE) ×3 IMPLANT
SUT VIC AB 3-0 SH 8-18 (SUTURE) ×3 IMPLANT
SWAB COLLECTION DEVICE MRSA (MISCELLANEOUS) ×3 IMPLANT
TOWEL OR 17X24 6PK STRL BLUE (TOWEL DISPOSABLE) ×3 IMPLANT
TOWEL OR 17X26 10 PK STRL BLUE (TOWEL DISPOSABLE) ×3 IMPLANT
TUBE ANAEROBIC SPECIMEN COL (MISCELLANEOUS) ×3 IMPLANT
WATER STERILE IRR 1000ML POUR (IV SOLUTION) ×3 IMPLANT

## 2015-12-11 NOTE — Anesthesia Preprocedure Evaluation (Signed)
Anesthesia Evaluation  Patient identified by MRN, date of birth, ID band Patient awake    Reviewed: Allergy & Precautions, NPO status , Patient's Chart, lab work & pertinent test results  History of Anesthesia Complications Negative for: history of anesthetic complications  Airway Mallampati: II  TM Distance: >3 FB Neck ROM: Full    Dental  (+) Teeth Intact   Pulmonary neg shortness of breath, neg sleep apnea, neg COPD, neg recent URI, Current Smoker, neg PE   breath sounds clear to auscultation       Cardiovascular negative cardio ROS   Rhythm:Regular     Neuro/Psych neg Seizures  Neuromuscular disease negative neurological ROS  negative psych ROS   GI/Hepatic negative GI ROS, Neg liver ROS,   Endo/Other  Hypothyroidism   Renal/GU negative Renal ROS     Musculoskeletal negative musculoskeletal ROS (+)   Abdominal   Peds  Hematology negative hematology ROS (+)   Anesthesia Other Findings   Reproductive/Obstetrics                             Anesthesia Physical  Anesthesia Plan  ASA: II  Anesthesia Plan: General   Post-op Pain Management:    Induction: Intravenous  Airway Management Planned: Oral ETT  Additional Equipment: None  Intra-op Plan:   Post-operative Plan: Extubation in OR  Informed Consent: I have reviewed the patients History and Physical, chart, labs and discussed the procedure including the risks, benefits and alternatives for the proposed anesthesia with the patient or authorized representative who has indicated his/her understanding and acceptance.   Dental advisory given  Plan Discussed with: CRNA and Anesthesiologist  Anesthesia Plan Comments:         Anesthesia Quick Evaluation

## 2015-12-11 NOTE — Anesthesia Procedure Notes (Signed)
Procedure Name: Intubation Date/Time: 12/11/2015 9:20 AM Performed by: Dairl PonderJIANG, Coila Wardell Pre-anesthesia Checklist: Patient identified, Timeout performed, Emergency Drugs available, Suction available and Patient being monitored Patient Re-evaluated:Patient Re-evaluated prior to inductionOxygen Delivery Method: Circle system utilized Preoxygenation: Pre-oxygenation with 100% oxygen Intubation Type: IV induction Ventilation: Mask ventilation without difficulty Laryngoscope Size: Mac and 3 Grade View: Grade I Tube type: Oral Tube size: 7.0 mm Number of attempts: 1 Airway Equipment and Method: Stylet Placement Confirmation: ETT inserted through vocal cords under direct vision,  breath sounds checked- equal and bilateral and positive ETCO2 Secured at: 22 cm Tube secured with: Tape Dental Injury: Teeth and Oropharynx as per pre-operative assessment

## 2015-12-11 NOTE — Progress Notes (Signed)
Patient getting out of bed with family member; he states, she wouldn't wait; tubes intact; patient standing at bedside; weak in the legs; able to get to the bedside commode with two assist; difficulty following commands and staying focused on task; safely returned to bed and informed of fall safety measures; bed alarm in use; continue to monitor safety.

## 2015-12-11 NOTE — Progress Notes (Signed)
Utilization Review Completed.Latoya Brady T12/31/2016  

## 2015-12-11 NOTE — Progress Notes (Signed)
Patient going to surgery at this time. Alert and in stable condition. Transported via stretcher.

## 2015-12-11 NOTE — ED Provider Notes (Signed)
MSE was initiated and I personally evaluated the patient and placed orders (if any) at  12:22 AM on December 11, 2015.  Patient here for evaluation of lower back pain, which is worse in the last several days. Today earlier, she had "fluid taken off" from her lower back, at a local radiology department. Since then her pain has worsened. She is using over-the-counter medications without relief of pain.  Exam alert, calm, cooperative. She is lucid.  Patients neurosurgeon contacted the ED charge nurse and reported that he would admit the patient for further evaluation and treatment.  The patient appears stable so that the remainder of the MSE may be completed by another provider.  Mancel BaleElliott Lamorris Knoblock, MD 12/11/15 [phone]

## 2015-12-11 NOTE — Transfer of Care (Signed)
Immediate Anesthesia Transfer of Care Note  Patient: Latoya Brady  Procedure(s) Performed: Procedure(s): EXPLORATION OF LUMBAR SEROMA (N/A)  Patient Location: PACU  Anesthesia Type:General  Level of Consciousness: awake, alert  and oriented  Airway & Oxygen Therapy: Patient Spontanous Breathing and Patient connected to nasal cannula oxygen  Post-op Assessment: Report given to RN and Post -op Vital signs reviewed and stable  Post vital signs: Reviewed and stable  Last Vitals:  Filed Vitals:   12/11/15 0800 12/11/15 1127  BP:    Pulse:    Temp:  36.6 C  Resp: 20 8    Complications: No apparent anesthesia complications

## 2015-12-11 NOTE — Progress Notes (Signed)
Patient returned to room 5C09 at 1300 from surgery. Alert and in stable condition but complaining os severe back pain. Medicated PRN with Fentanyl.

## 2015-12-11 NOTE — Op Note (Signed)
NAMKendell Bane:  Whittington, Ziggy             ACCOUNT NO.:  [id number]  MEDICAL RECORD NO.:  [id number]  LOCATION:  [facility]                         FACILITY:  [facility]  PHYSICIAN:  Hilda LiasErnesto Bobbette Eakes, M.D.   DATE OF BIRTH:  1971-05-08  DATE OF PROCEDURE:  12/11/2015 DATE OF DISCHARGE:                              OPERATIVE REPORT   PREOPERATIVE DIAGNOSES:  L5-S1 lumbar seroma, status post L5-S1 fusion. Radiculopathy.  POSTOPERATIVE DIAGNOSES:  L5-S1 lumbar seroma, status post L5-S1 fusion. Radiculopathy.  PROCEDURE:  Exploration of the lumbar wound.  Removal of the lumbar seroma.  Specimen sent to the lab.  Exploration of the pedicle screws on the L5 and S1 root.  Removal of a granulation tissue surrounding the both nerve root.  Microscope.  SURGEON:  Hilda LiasErnesto Kenzlee Fishburn, M.D.  CLINICAL HISTORY:  Mrs. Fridman is a lady, who underwent fusion at L5-S1 about 4 weeks ago.  Lately, she had been having pain down to the right leg.  Yesterday, she had an MRI, which showed seroma.  She has aspiration and it helped for a few hours, but then the pain got worse and she ended up in the emergency room, admitted to my service.  She was taken to surgery today.  She and husband knew about the MRI findings.  DESCRIPTION OF PROCEDURE:  The patient was taken to the OR.  After intubation, she was positioned in a prone manner.  The back was cleaned with dura prep.  Drapes were applied.  Incision in the midline following the lower part of the scar was made through the skin and subcutaneous tissue.  Immediately, fluid came.  The specimen was sent to the lab. There was no evidence of an infection.  We investigated the area of the right and left.  The screws were in good position.  What we found mostly quite a bit of swelling of the L5 and S1 nerve root with quite a bit of granulation tissue.  Lysis was accomplished with plenty of space now for the nerve roots.  The dural sac was negative.  We did Valsalva maneuver, tried to  explain the seroma, but it was essentially negative.  The area was irrigated.  We did an x-ray, just to be sure that there was no any displacement of the screws.  After everything was negative, the area was irrigated.  A small drain was left in the operative site and the wound was closed with Vicryl and Dermabond.          ______________________________ Hilda LiasErnesto Simran Bomkamp, M.D.     EB/MEDQ  D:  12/11/2015  T:  12/11/2015  Job:  [id number]

## 2015-12-11 NOTE — Anesthesia Postprocedure Evaluation (Signed)
Anesthesia Post Note  Patient: Martie LeeSabrina E Willenbring  Procedure(s) Performed: Procedure(s) (LRB): EXPLORATION OF LUMBAR SEROMA (N/A)  Patient location during evaluation: PACU Anesthesia Type: General Level of consciousness: awake and alert Pain management: pain level controlled Vital Signs Assessment: post-procedure vital signs reviewed and stable Respiratory status: spontaneous breathing, nonlabored ventilation and respiratory function stable Cardiovascular status: blood pressure returned to baseline and stable Postop Assessment: no signs of nausea or vomiting Anesthetic complications: no    Last Vitals:  Filed Vitals:   12/11/15 1127 12/11/15 1153  BP:    Pulse:    Temp: 36.6 C   Resp: 8 11    Last Pain:  Filed Vitals:   12/11/15 1157  PainSc: 5                  Reino KentJudd, Deloise Marchant J

## 2015-12-12 MED ORDER — OXYCODONE HCL 5 MG PO TABS
10.0000 mg | ORAL_TABLET | ORAL | Status: DC | PRN
  Administered 2015-12-12 – 2015-12-13 (×3): 20 mg via ORAL
  Filled 2015-12-12 (×3): qty 4

## 2015-12-12 NOTE — Progress Notes (Signed)
Subjective: Patient reports still quite painful  Objective: Vital signs in last 24 hours: Temp:  [97.9 F (36.6 C)-98.5 F (36.9 C)] 98.5 F (36.9 C) (01/01 0937) Pulse Rate:  [64-84] 83 (01/01 0937) Resp:  [7-14] 10 (01/01 0937) BP: (87-136)/(45-90) 92/46 mmHg (01/01 0937) SpO2:  [98 %-100 %] 100 % (01/01 0937)  Intake/Output from previous day: 12/31 0701 - 01/01 0700 In: 700 [I.V.:700] Out: 250 [Drains:100; Blood:150] Intake/Output this shift:    Physical Exam: Dressing CDI.  Patient discontinued drain during night.  Leg pain stable.  Lab Results:  Recent Labs  12/10/15 2004  WBC 6.8  HGB 11.5*  HCT 33.9*  PLT 301   BMET  Recent Labs  12/10/15 2004  NA 143  K 3.9  CL 109  CO2 25  GLUCOSE 100*  BUN 7  CREATININE 0.74  CALCIUM 9.6    Studies/Results: Dg Lumbar Spine 1 View  12/11/2015  CLINICAL DATA:  Lumbar wound I and D done in Neuro OR 4. Radiation safety time out done at 10:30 AM EXAM: LUMBAR SPINE - 1 VIEW COMPARISON:  12/10/2015, 12/09/2015 FINDINGS: Posterior retractor is in place. patient's transpedicular screws at L5 and S1. Interbody fusion devices identified at L5-S1. There are 2 surgical instruments along the posterior aspect of the spine, the first overlying the posterior vertebral body at L5. The second overlies the posterior vertebral body at S1. IMPRESSION: Intraoperative localization. Electronically Signed   By: Norva PavlovElizabeth  Brown M.D.   On: 12/11/2015 10:46    Assessment/Plan: Discontinuing PCA.  Mobilize with PT.    LOS: 1 day    Dorian HeckleSTERN,Kamylle Axelson D, MD 12/12/2015, 9:59 AM

## 2015-12-12 NOTE — Progress Notes (Signed)
Patient still in pain, however pain level appears much more manageable now. Assisting patient in readjusting, heat therapy, cold pack therapy, po pain medication regimen, and sat in the chair for a short while. Will continue to monitor patient's pain and provide assistance.

## 2015-12-12 NOTE — Progress Notes (Signed)
Pt removed hemovac drain while making movements in bed. Was called to room by family member to find drain tubing removed laying in bed beside pt. No bleeding from site noted, dressing intact. hemovac had drained 40ml since last emptied at 1800hrs..Marland Kitchen

## 2015-12-12 NOTE — Progress Notes (Signed)
Patient crying and c/o that she was still in a lot of pain husband at bedside wanted something else given. Fentanyl  Was given as ordered . Valium and Two Percocet's was given earlier

## 2015-12-12 NOTE — Progress Notes (Signed)
OT Cancellation Note  Patient Details Name: Latoya Brady MRN: [id number] DOB: 06-Nov-1971   Cancelled Treatment:    Reason Eval/Treat Not Completed: Other (comment) (Pt's spouse requested OT to hold off on therapy at this time as pt has been in a lot of pain and is resting now).  [name], [name] L OTR/L [phone] 12/12/2015, 1:38 PM

## 2015-12-12 NOTE — Progress Notes (Signed)
Wasted 10 cc of  Dilaudid in sink from PCA [employer]. PenneyMelissa Elzondo  Witnessed the waste

## 2015-12-12 NOTE — Evaluation (Signed)
Physical Therapy Evaluation Patient Details Name: Latoya Brady MRN: [id number] DOB: 08-19-1971 Today's Date: 12/12/2015   History of Present Illness  Pt is a 45 y/o F s/p revision of L5-S1 fusion, additionally, imaging revealed seroma.    Clinical Impression  Patient is s/p above surgery resulting in functional limitations due to the deficits listed below (see PT Problem List). Latoya Brady is very lethargic today but is agreeable to therapy.  Pt ambulated 70 ft w/ min guard assist.  She will have 24/7 assist from her husband at d/c.  Patient will benefit from skilled PT to increase their independence and safety with mobility to allow discharge to the venue listed below.      Follow Up Recommendations Home health PT;Supervision for mobility/OOB    Equipment Recommendations  None recommended by PT    Recommendations for Other Services OT consult     Precautions / Restrictions Precautions Precautions: Back Precaution Booklet Issued: Yes (comment) Precaution Comments: Reviewed back precautions w/ pt Required Braces or Orthoses: Spinal Brace Spinal Brace: Lumbar corset (no specific order, pt has been using at home) Restrictions Weight Bearing Restrictions: No      Mobility  Bed Mobility Overal bed mobility: Needs Assistance Bed Mobility: Sidelying to Sit   Sidelying to sit: Mod assist       General bed mobility comments: Husband assisting pt to sit up before PT can assist.  Pt required HHA to assist in boost.    Transfers Overall transfer level: Needs assistance Equipment used: Rolling walker (2 wheeled) Transfers: Sit to/from Stand Sit to Stand: Min assist         General transfer comment: Min assist to boost up to standing.  Cues for safe use of RW and to reach back to armrests when going to sit.  Ambulation/Gait Ambulation/Gait assistance: Min guard Ambulation Distance (Feet): 70 Feet Assistive device: Rolling walker (2 wheeled) Gait Pattern/deviations:  Step-through pattern;Decreased dorsiflexion - right;Decreased stride length;Antalgic;Trunk flexed;Shuffle   Gait velocity interpretation: <1.8 ft/sec, indicative of risk for recurrent falls General Gait Details: Close min guard and dec DF on Rt LE.  Pt shuffles feet.  Stairs            Wheelchair Mobility    Modified Rankin (Stroke Patients Only)       Balance Overall balance assessment: Needs assistance Sitting-balance support: Feet supported;No upper extremity supported Sitting balance-Leahy Scale: Good     Standing balance support: Bilateral upper extremity supported;During functional activity Standing balance-Leahy Scale: Fair                               Pertinent Vitals/Pain Pain Assessment: 0-10 Pain Score: 10-Worst pain ever Pain Location: back Pain Descriptors / Indicators:  ("hurts like hell") Pain Intervention(s): Limited activity within patient's tolerance;Monitored during session;Premedicated before session;Repositioned    Home Living Family/patient expects to be discharged to:: Private residence Living Arrangements: Spouse/significant other Available Help at Discharge: Family;Available 24 hours/day Type of Home: House Home Access: Stairs to enter Entrance Stairs-Rails: LawyerLeft;Right Entrance Stairs-Number of Steps: 3 Home Layout: One level Home Equipment: Walker - 2 wheels;Bedside commode Additional Comments: Husband is taking off work for the next week    Prior Function Level of Independence: Independent with assistive device(s)         Comments: RW for ambulation     Hand Dominance   Dominant Hand: Left    Extremity/Trunk Assessment   Upper Extremity Assessment: Defer to  OT evaluation           Lower Extremity Assessment: RLE deficits/detail;LLE deficits/detail RLE Deficits / Details: Numbness from hip down to foot reported by pt.  DF: 2/5, otherwise strength grossly 4/5.  LLE Deficits / Details: strength grossly  5/5  Cervical / Trunk Assessment: Other exceptions  Communication   Communication: No difficulties  Cognition Arousal/Alertness: Lethargic;Suspect due to medications Behavior During Therapy: Flat affect Overall Cognitive Status: Within Functional Limits for tasks assessed                      General Comments      Exercises        Assessment/Plan    PT Assessment Patient needs continued PT services  PT Diagnosis Difficulty walking;Acute pain   PT Problem List Decreased strength;Decreased activity tolerance;Decreased balance;Decreased knowledge of use of DME;Decreased safety awareness;Impaired sensation;Pain  PT Treatment Interventions DME instruction;Gait training;Stair training;Functional mobility training;Therapeutic activities;Therapeutic exercise;Balance training;Neuromuscular re-education;Patient/family education;Modalities   PT Goals (Current goals can be found in the Care Plan section) Acute Rehab PT Goals Patient Stated Goal: none stated PT Goal Formulation: With patient/family Time For Goal Achievement: 12/22/15 Potential to Achieve Goals: Good    Frequency Min 5X/week   Barriers to discharge Inaccessible home environment steps to enter home    Co-evaluation               End of Session Equipment Utilized During Treatment: Back brace Activity Tolerance: Patient limited by lethargy;Patient tolerated treatment well Patient left: in chair;with call bell/phone within reach;with family/visitor present Nurse Communication: Mobility status;Precautions         Time: 9604-5409 PT Time Calculation (min) (ACUTE ONLY): 16 min   Charges:   PT Evaluation $Initial PT Evaluation Tier I: 1 Procedure     PT G Codes:       Michail Jewels PT, DPT [phone] Pager: [phone] 12/12/2015, 2:33 PM

## 2015-12-12 NOTE — Progress Notes (Signed)
Pt ambulated in hallway from room to 53M unit and back w/o complication.

## 2015-12-13 LAB — BODY FLUID CULTURE
Gram Stain: NONE SEEN
Organism ID, Bacteria: NO GROWTH

## 2015-12-13 MED ORDER — DIAZEPAM 5 MG PO TABS: 5.0000 mg | ORAL_TABLET | Freq: Four times a day (QID) | ORAL | Status: DC | PRN

## 2015-12-13 MED ORDER — OXYCODONE-ACETAMINOPHEN 5-325 MG PO TABS: 1.0000 | ORAL_TABLET | ORAL | Status: DC | PRN

## 2015-12-13 NOTE — Discharge Summary (Signed)
Physician Discharge Summary  Patient ID: Latoya Brady MRN: [name] DOB/AGE: 47-Jun-1972 47 y.o.  Admit date: 12/10/2015 Discharge date: 12/13/2015  Admission Diagnoses: Lumbar wound seroma   Discharge Diagnoses: Lumbar wound seroma  Active Problems:   Recurrent low back pain   Lumbar radiculopathy   Discharged Condition: fair  Hospital Course: Patient was admitted to undergo surgical exploration of a seroma and lumbar wound 4 weeks status post decompression fusion at L5-S1 has residual right-sided radicular pain but is otherwise stable.  Consults: None  Significant Diagnostic Studies: None  Treatments: surgery: Exploration of lumbar wound decompression seroma  Discharge Exam: Blood pressure 91/74, pulse 86, temperature 98.3 F (36.8 C), temperature source Oral, resp. rate 18, height 5\' 2"  (1.575 m), weight 64.864 kg (143 lb), SpO2 98 %. Incision is clean and dry motor function appears intact patient walks with marked antalgia on right leg  Disposition: 01-Home or Self Care  Discharge Instructions    Call MD for:  redness, tenderness, or signs of infection (pain, swelling, redness, odor or green/yellow discharge around incision site)    Complete by:  As directed      Call MD for:  severe uncontrolled pain    Complete by:  As directed      Call MD for:  temperature >100.4    Complete by:  As directed      Diet - low sodium heart healthy    Complete by:  As directed      Increase activity slowly    Complete by:  As directed             Medication List    TAKE these medications        diazepam 5 MG tablet  Commonly known as:  VALIUM  Take 2.5 mg by mouth every 6 (six) hours as needed for anxiety.     diazepam 5 MG tablet  Commonly known as:  VALIUM  Take 1 tablet (5 mg total) by mouth every 6 (six) hours as needed for muscle spasms.     docusate sodium 100 MG capsule  Commonly known as:  COLACE  Take 100 mg by mouth daily as needed for mild constipation.      gabapentin 300 MG capsule  Commonly known as:  NEURONTIN  Take 300 mg by mouth 3 (three) times daily.     levothyroxine 75 MCG tablet  Commonly known as:  SYNTHROID, LEVOTHROID  Take 75 mcg by mouth daily before breakfast.     oxyCODONE 5 MG immediate release tablet  Commonly known as:  Oxy IR/ROXICODONE  Take 2.5-5 mg by mouth every 4 (four) hours as needed for severe pain.     oxyCODONE-acetaminophen 5-325 MG tablet  Commonly known as:  PERCOCET/ROXICET  Take 1-2 tablets by mouth every 4 (four) hours as needed for moderate pain.     polyethylene glycol packet  Commonly known as:  MIRALAX / GLYCOLAX  Take 17 g by mouth daily as needed for moderate constipation.     traMADol 50 MG tablet  Commonly known as:  ULTRAM  Take 50 mg by mouth every 6 (six) hours as needed for moderate pain.         SignedStefani Dama: Eulalia Ellerman J 12/13/2015, 1:59 PM

## 2015-12-13 NOTE — Progress Notes (Signed)
Physical Therapy Treatment Patient Details Name: Patience MuscaSabrina E Flax MRN: [id number] DOB: 02-01-1971 Today's Date: 12/13/2015    History of Present Illness Pt is a 45 y/o F s/p revision of L5-S1 fusion, additionally, imaging revealed seroma.      PT Comments    Pt making good progress.  Follow Up Recommendations  Home health PT;Supervision - Intermittent     Equipment Recommendations  None recommended by PT    Recommendations for Other Services       Precautions / Restrictions Precautions Precautions: Back Precaution Booklet Issued: Yes (comment) Precaution Comments: Pt demonstrated adherence to back precautions Required Braces or Orthoses: Spinal Brace Spinal Brace: Lumbar corset;Applied in sitting position Restrictions Weight Bearing Restrictions: No    Mobility  Bed Mobility Overal bed mobility: Needs Assistance Bed Mobility: Sidelying to Sit   Sidelying to sit: Supervision     Sit to sidelying: Supervision General bed mobility comments: Pt using rail to push into sitting  Transfers Overall transfer level: Needs assistance Equipment used: Rolling walker (2 wheeled) Transfers: Sit to/from Stand Sit to Stand: Supervision         General transfer comment: for safety  Ambulation/Gait Ambulation/Gait assistance: Supervision Ambulation Distance (Feet): 200 Feet Assistive device: Rolling walker (2 wheeled) Gait Pattern/deviations: Step-through pattern;Decreased step length - right;Decreased weight shift to right;Decreased dorsiflexion - right Gait velocity: decr Gait velocity interpretation: Below normal speed for age/gender General Gait Details: Pt with some sliding of rt foot forward instead of picking all the way up   Stairs Stairs: Yes Stairs assistance: Min assist Stair Management: Two rails;Step to pattern;Forwards Number of Stairs: 1 General stair comments: used walker handles to simulate rails  Wheelchair Mobility    Modified Rankin (Stroke  Patients Only)       Balance Overall balance assessment: Needs assistance Sitting-balance support: No upper extremity supported Sitting balance-Leahy Scale: Good     Standing balance support: No upper extremity supported;During functional activity Standing balance-Leahy Scale: Fair                      Cognition Arousal/Alertness: Awake/alert Behavior During Therapy: Flat affect Overall Cognitive Status: Within Functional Limits for tasks assessed                      Exercises      General Comments        Pertinent Vitals/Pain Pain Assessment: 0-10 Pain Score: 8  Pain Location: rt buttock Pain Descriptors / Indicators: Constant;Throbbing Pain Intervention(s): Limited activity within patient's tolerance;Premedicated before session    Home Living                      Prior Function            PT Goals (current goals can now be found in the care plan section) Acute Rehab PT Goals Patient Stated Goal: none stated Progress towards PT goals: Progressing toward goals    Frequency  Min 5X/week    PT Plan Current plan remains appropriate    Co-evaluation             End of Session Equipment Utilized During Treatment: Back brace Activity Tolerance: Patient tolerated treatment well Patient left: with call bell/phone within reach;with family/visitor present;in bed     Time: 1056-1106 PT Time Calculation (min) (ACUTE ONLY): 10 min  Charges:  $Gait Training: 8-22 mins  G Codes:      Xayla Puzio 12/13/2015, 3:23 PM [facility] PT [phone]

## 2015-12-13 NOTE — Progress Notes (Signed)
Occupational Therapy Evaluation Patient Details Name: Latoya Brady MRN: [id number] DOB: 07/31/1971 Today's Date: 12/13/2015    History of Present Illness Pt is a 45 y/o F s/p revision of L5-S1 fusion, additionally, imaging revealed seroma.     Clinical Impression   PTA, pt was 4 weeks s/p lumbar fusion and required supervision level assist from husband for ADLs and used a RW for mobility. Pt currently presents with severe R buttock and leg pain with severity increasing distally in foot. Pt completed ADLs and mobility with min guard assist for safety due to lethargy and dragging RLE due to pain. Pt will benefit from acute skilled OT to increase independence and safety with ADLs and functional mobility to allow for safe discharge home.    Follow Up Recommendations  No OT follow up;Supervision - Intermittent    Equipment Recommendations  None recommended by OT    Recommendations for Other Services       Precautions / Restrictions Precautions Precautions: Back Precaution Booklet Issued: Yes (comment) Precaution Comments: Pt able to recall and demonstrate adherence to all back precautions Required Braces or Orthoses: Spinal Brace Spinal Brace: Lumbar corset;Applied in sitting position Restrictions Weight Bearing Restrictions: No      Mobility Bed Mobility               General bed mobility comments: Pt sitting EOB on OT arrival and declined laying down due to severe R leg pain  Transfers Overall transfer level: Needs assistance Equipment used: Rolling walker (2 wheeled) Transfers: Sit to/from Stand Sit to Stand: Min guard         General transfer comment: Min guard for safety due to pt's lethargy from pain medication. Good demonstration of safe hand placement and RW use. Some unsteadiness upon standing due to severe R leg pain    Balance Overall balance assessment: Needs assistance Sitting-balance support: No upper extremity supported;Feet supported Sitting  balance-Leahy Scale: Good Sitting balance - Comments: Able to shift side to side for pressure relief due to R buttock pain   Standing balance support: Bilateral upper extremity supported;During functional activity Standing balance-Leahy Scale: Fair Standing balance comment: Some unsteadiness due to R leg pain and needing to weight shift from pressure relief                            ADL Overall ADL's : Needs assistance/impaired Eating/Feeding: Independent   Grooming: Wash/dry hands;Oral care;Min guard                   Toilet Transfer: Min guard;Ambulation;Regular Toilet;RW   Toileting- Architect and Hygiene: Min guard;Adhering to back precautions;Sit to/from stand       Functional mobility during ADLs: Min guard;Rolling walker General ADL Comments: Pt with severe R leg pain impacting her ability to complete ADLs independently and safely. Excellent adherence to back precautions and compensatory strategies without cues.     Vision Vision Assessment?: No apparent visual deficits   Perception     Praxis      Pertinent Vitals/Pain Pain Assessment: 0-10 Pain Score: 8  Pain Location: Rt buttock and down R leg - worst in R foot Pain Descriptors / Indicators: Constant;Throbbing;Stabbing Pain Intervention(s): Limited activity within patient's tolerance;Monitored during session;Premedicated before session;Repositioned     Hand Dominance Left   Extremity/Trunk Assessment Upper Extremity Assessment Upper Extremity Assessment: Overall WFL for tasks assessed   Lower Extremity Assessment Lower Extremity Assessment: Defer to PT evaluation  Cervical / Trunk Assessment Cervical / Trunk Assessment: Other exceptions Cervical / Trunk Exceptions: s/p lumbar fusion   Communication Communication Communication: No difficulties   Cognition Arousal/Alertness: Lethargic;Suspect due to medications Behavior During Therapy: Flat affect Overall Cognitive  Status: Within Functional Limits for tasks assessed                     General Comments       Exercises       Shoulder Instructions      Home Living Family/patient expects to be discharged to:: Private residence Living Arrangements: Spouse/significant other Available Help at Discharge: Family;Available 24 hours/day Type of Home: House Home Access: Stairs to enter Enter[employer] CorporationEntrance Stairs-Number of Steps: 3 Entrance Stairs-Rails: Left;Right Home Layout: One level     Bathroom Shower/Tub: Tub/shower unit;Door Shower/tub characteristics: Door Bathroom Toilet: Handicapped height Bathroom Accessibility: Yes How Accessible: Accessible via walker Home Equipment: Walker - 2 wheels;Bedside commode   Additional Comments: Husband is taking off work for the next week. Pt's mother and sister-in-law do not work and are available to assist as needed      Prior Functioning/Environment Level of Independence: Independent with assistive device(s)        Comments: RW for ambulation    OT Diagnosis: Acute pain   OT Problem List: Decreased strength;Decreased range of motion;Decreased activity tolerance;Impaired balance (sitting and/or standing);Decreased coordination;Decreased safety awareness;Decreased knowledge of use of DME or AE;Pain   OT Treatment/Interventions: Self-care/ADL training;Therapeutic exercise;DME and/or AE instruction;Therapeutic activities;Patient/family education;Balance training    OT Goals(Current goals can be found in the care plan section) Acute Rehab OT Goals Patient Stated Goal: to decrease leg pain OT Goal Formulation: With patient Time For Goal Achievement: 12/27/15 Potential to Achieve Goals: Good ADL Goals Pt Will Perform Lower Body Bathing: with supervision;sit to/from stand Pt Will Perform Lower Body Dressing: with supervision;sit to/from stand Pt Will Transfer to Toilet: with modified independence;ambulating;bedside commode Pt Will Perform Toileting -  Clothing Manipulation and hygiene: with modified independence;sit to/from stand Pt Will Perform Tub/Shower Transfer: Tub transfer;with supervision;with caregiver independent in assisting;ambulating;3 in 1;rolling walker  OT Frequency: Min 2X/week   Barriers to D/C:            Co-evaluation              End of Session Equipment Utilized During Treatment: Rolling walker;Back brace Nurse Communication: Mobility status;Precautions  Activity Tolerance: Patient limited by pain Patient left: in chair;with call bell/phone within reach;with family/visitor present   Time: [id number] OT Time Calculation (min): 32 min Charges:  OT General Charges $OT Visit: 1 Procedure OT Evaluation $Initial OT Evaluation Tier I: 1 Procedure Moderate Complexity Evaluation: 97162 OT Treatments $Self Care/Home Management : 8-22 mins G-Codes:    Nils PyleJulia Angel Weedon, OTR/L 12/13/2015, 9:47 AM  Pager: [phone]

## 2015-12-13 NOTE — Progress Notes (Signed)
Pt to be discharged to home via wheelchair. IV d/c'd, gauze applied.

## 2015-12-14 ENCOUNTER — Encounter (HOSPITAL_COMMUNITY): Payer: Self-pay | Admitting: Neurosurgery

## 2015-12-14 LAB — WOUND CULTURE
CULTURE: NO GROWTH
Gram Stain: NONE SEEN

## 2015-12-15 ENCOUNTER — Ambulatory Visit (HOSPITAL_COMMUNITY)
Admission: RE | Admit: 2015-12-15 | Discharge: 2015-12-15 | Disposition: A | Discharge status: Home / Self Care | Payer: 59 | Source: Ambulatory Visit | Attending: Vascular Surgery | Admitting: Vascular Surgery

## 2015-12-15 ENCOUNTER — Other Ambulatory Visit (HOSPITAL_COMMUNITY): Payer: Self-pay | Admitting: Neurosurgery

## 2015-12-15 DIAGNOSIS — M7989 Other specified soft tissue disorders: Secondary | ICD-10-CM | POA: Diagnosis present

## 2015-12-15 LAB — ANAEROBIC CULTURE: GRAM STAIN: NONE SEEN

## 2015-12-15 NOTE — Progress Notes (Signed)
[facility].

## 2015-12-17 LAB — ANAEROBIC CULTURE: Gram Stain: NONE SEEN

## 2015-12-22 ENCOUNTER — Other Ambulatory Visit: Payer: Self-pay | Admitting: Neurosurgery

## 2015-12-22 DIAGNOSIS — M5416 Radiculopathy, lumbar region: Secondary | ICD-10-CM

## 2015-12-23 ENCOUNTER — Other Ambulatory Visit: Payer: Self-pay | Admitting: Neurosurgery

## 2015-12-23 ENCOUNTER — Ambulatory Visit
Admission: RE | Admit: 2015-12-23 | Discharge: 2015-12-23 | Disposition: A | Discharge status: Home / Self Care | Payer: 59 | Source: Ambulatory Visit | Attending: Neurosurgery | Admitting: Neurosurgery

## 2015-12-23 DIAGNOSIS — M5416 Radiculopathy, lumbar region: Secondary | ICD-10-CM

## 2015-12-23 MED ORDER — METHYLPREDNISOLONE ACETATE 40 MG/ML INJ SUSP (RADIOLOG
120.0000 mg | Freq: Once | INTRAMUSCULAR | Status: AC
  Administered 2015-12-23: 120 mg via EPIDURAL

## 2015-12-23 MED ORDER — IOHEXOL 180 MG/ML  SOLN
1.0000 mL | Freq: Once | INTRAMUSCULAR | Status: AC | PRN
  Administered 2015-12-23: 1 mL via EPIDURAL

## 2015-12-23 NOTE — Discharge Instructions (Signed)

## 2015-12-25 ENCOUNTER — Inpatient Hospital Stay (HOSPITAL_COMMUNITY)
Admission: EM | Admit: 2015-12-25 | Discharge: 2015-12-25 | DRG: 552 | Disposition: A | Discharge status: Home / Self Care | Payer: 59 | Attending: Neurosurgery | Admitting: Neurosurgery

## 2015-12-25 ENCOUNTER — Encounter (HOSPITAL_COMMUNITY): Payer: Self-pay | Admitting: Emergency Medicine

## 2015-12-25 DIAGNOSIS — M5441 Lumbago with sciatica, right side: Secondary | ICD-10-CM | POA: Diagnosis present

## 2015-12-25 DIAGNOSIS — M549 Dorsalgia, unspecified: Secondary | ICD-10-CM | POA: Diagnosis present

## 2015-12-25 DIAGNOSIS — E039 Hypothyroidism, unspecified: Secondary | ICD-10-CM | POA: Diagnosis present

## 2015-12-25 DIAGNOSIS — Z79899 Other long term (current) drug therapy: Secondary | ICD-10-CM

## 2015-12-25 DIAGNOSIS — F17210 Nicotine dependence, cigarettes, uncomplicated: Secondary | ICD-10-CM | POA: Diagnosis present

## 2015-12-25 DIAGNOSIS — G8929 Other chronic pain: Secondary | ICD-10-CM | POA: Diagnosis present

## 2015-12-25 HISTORY — DX: Dorsalgia, unspecified: M54.9

## 2015-12-25 MED ORDER — ONDANSETRON HCL 4 MG/2ML IJ SOLN: 4.0000 mg | Freq: Three times a day (TID) | INTRAMUSCULAR | Status: DC | PRN

## 2015-12-25 MED ORDER — HYDROMORPHONE HCL 1 MG/ML IJ SOLN
1.0000 mg | Freq: Once | INTRAMUSCULAR | Status: AC
  Administered 2015-12-25: 1 mg via INTRAVENOUS

## 2015-12-25 MED ORDER — HYDROMORPHONE HCL 1 MG/ML IJ SOLN
1.0000 mg | Freq: Once | INTRAMUSCULAR | Status: AC
Start: 2015-12-25 — End: 2015-12-25
  Administered 2015-12-25: 1 mg via INTRAVENOUS
  Filled 2015-12-25: qty 1

## 2015-12-25 MED ORDER — DIAZEPAM 5 MG/ML IJ SOLN
10.0000 mg | Freq: Once | INTRAMUSCULAR | Status: AC
  Administered 2015-12-25: 10 mg via INTRAVENOUS
  Filled 2015-12-25: qty 2

## 2015-12-25 MED ORDER — KETOROLAC TROMETHAMINE 30 MG/ML IJ SOLN
30.0000 mg | Freq: Once | INTRAMUSCULAR | Status: AC
  Administered 2015-12-25: 30 mg via INTRAVENOUS
  Filled 2015-12-25: qty 1

## 2015-12-25 MED ORDER — FENTANYL CITRATE (PF) 100 MCG/2ML IJ SOLN
100.0000 ug | INTRAMUSCULAR | Status: DC | PRN
  Administered 2015-12-25 (×2): 100 ug via INTRAVENOUS
  Filled 2015-12-25 (×2): qty 2

## 2015-12-25 MED ORDER — HYDROMORPHONE HCL 1 MG/ML IJ SOLN
1.0000 mg | Freq: Once | INTRAMUSCULAR | Status: DC
  Filled 2015-12-25 (×2): qty 1

## 2015-12-25 MED ORDER — HYDROMORPHONE HCL 1 MG/ML IJ SOLN
1.0000 mg | INTRAMUSCULAR | Status: DC | PRN
  Filled 2015-12-25: qty 1

## 2015-12-25 MED ORDER — DIAZEPAM 5 MG/ML IJ SOLN
5.0000 mg | Freq: Once | INTRAMUSCULAR | Status: AC
  Administered 2015-12-25: 5 mg via INTRAVENOUS
  Filled 2015-12-25: qty 2

## 2015-12-25 NOTE — ED Notes (Signed)
Dr. Botero at bedside. 

## 2015-12-25 NOTE — ED Notes (Signed)
Pt and pt's visitors upset about waiting for bed. Pt under the impression that she would have a bed immediately upon arrival. RN explained to pt the process of admission and that unfortunately wait times are high for beds today. RN offered comforts of food, blanket. Denied at this time. RN informed pt she would page Dr. Jeral FruitBotero and ask for pain medication and time frame for when he would be coming to see the pt.

## 2015-12-25 NOTE — ED Notes (Signed)
Gave pt 2 warm blankets

## 2015-12-25 NOTE — ED Provider Notes (Signed)
CSN: [id number]     Arrival date & time 12/25/15  1219 History   First MD Initiated Contact with Patient 12/25/15 1305     Chief Complaint  Patient presents with  . Back Pain   HPI  Latoya Brady is a 45 year old female with a history of chronic back pain presenting with back and leg pain. She has a history of chronic back pain for which she is followed by Dr. [facility]eral [facility]. She recently had a lower back steroid injection for pain in her right foot from sciatica. She states this was approximately one week ago and her pain has severely worsened since the injection. She states that the burning in her foot is keeping her from walking. She has to use her walker to get around. The pain is radiating up the back of her thigh and into her lower back. She states that her right leg feels weak secondary to the pain. She denies exacerbating factors; the pain is severe and constant "no matter what I do". She is also complaining of numbness of the right lower extremity that she states is chronic. She denies fevers, chills, syncope, rash, incontinence of bowel or bladder. She called Dr. Cassandria SanteeBotero's office PTA who instructed them to come to ED for admission.  Past Medical History  Diagnosis Date  . Hypothyroidism   . Chronic back pain    Past Surgical History  Procedure Laterality Date  . Abdominal hysterectomy    . Back surgery    . Carpal tunnel release Bilateral   . Lumbar wound debridement N/A 12/11/2015    Procedure: EXPLORATION OF LUMBAR SEROMA;  Surgeon: Hilda LiasErnesto Botero, MD;  Location: [facility];  Service: Neurosurgery;  Laterality: N/A;   No family history on file. Social History  Substance Use Topics  . Smoking status: Current Every Day Smoker -- 0.75 packs/day for 25 years    Types: Cigarettes  . Smokeless tobacco: Never Used  . Alcohol Use: No   OB History    No data available     Review of Systems  Musculoskeletal: Positive for myalgias, back pain and gait problem.   All other systems reviewed and are negative.     Allergies  Macrobid  Home Medications   Prior to Admission medications   Medication Sig Start Date End Date Taking? Authorizing Provider  diazepam (VALIUM) 5 MG tablet Take 2.5 mg by mouth every 6 (six) hours as needed for anxiety.    Yes Historical Provider, MD  diazepam (VALIUM) 5 MG tablet Take 1 tablet (5 mg total) by mouth every 6 (six) hours as needed for muscle spasms. 12/13/15  Yes Barnett AbuHenry Elsner, MD  docusate sodium (COLACE) 100 MG capsule Take 100 mg by mouth daily as needed for mild constipation.   Yes Historical Provider, MD  gabapentin (NEURONTIN) 300 MG capsule Take 300 mg by mouth 3 (three) times daily.   Yes Historical Provider, MD  levothyroxine (SYNTHROID, LEVOTHROID) 75 MCG tablet Take 75 mcg by mouth daily before breakfast.   Yes Historical Provider, MD  oxyCODONE (ROXICODONE) 15 MG immediate release tablet Take 15 mg by mouth every 8 (eight) hours as needed for pain.  12/15/15  Yes Historical Provider, MD  oxyCODONE-acetaminophen (PERCOCET/ROXICET) 5-325 MG tablet Take 1-2 tablets by mouth every 4 (four) hours as needed for moderate pain. 12/13/15  Yes Barnett AbuHenry Elsner, MD  polyethylene glycol (MIRALAX / GLYCOLAX) packet Take 17 g by mouth daily as needed for moderate constipation.   Yes Historical  Provider, MD  traMADol (ULTRAM) 50 MG tablet Take 50 mg by mouth every 6 (six) hours as needed for moderate pain.   Yes Historical Provider, MD   BP 104/66 mmHg  Pulse 65  Temp(Src) 97.6 F (36.4 C) (Oral)  Resp 18  SpO2 100% Physical Exam  Constitutional: She appears well-developed and well-nourished. She appears distressed.  Tearful; appears in pain  HENT:  Head: Normocephalic and atraumatic.  Eyes: Conjunctivae are normal. Right eye exhibits no discharge. Left eye exhibits no discharge. No scleral icterus.  Neck: Normal range of motion.  Cardiovascular: Normal rate, regular rhythm and intact distal pulses.   Pedal pulses  palpable. Cap refill < 3 seconds  Pulmonary/Chest: Effort normal. No respiratory distress.  Musculoskeletal: Normal range of motion.  Lumbar region is non-tender. FROM of BLE intact though she is reluctant to move them. No edema or obvious deformity. Midline lumbar surgical wound with routine healing and some skin glue still in place. No signs of infection from injection site or old surgical wound.   Neurological: She is alert. Coordination normal.  Refusing strength testing. Sensation to light touch intact over BLE.   Skin: Skin is warm and dry.  Psychiatric: She has a normal mood and affect. Her behavior is normal.  Nursing note and vitals reviewed.   ED Course  Procedures (including critical care time) Labs Review Labs Reviewed - No data to display  Imaging Review No results found. I have personally reviewed and evaluated these images and lab results as part of my medical decision-making.   EKG Interpretation None      MDM   Final diagnoses:  Right-sided low back pain with right-sided sciatica   Patient presenting with acute worsening of chronic back pain located by sciatica. She received a lumbar injection approximately one week ago and has reported worsening pain since. Vital signs stable. Patient appears to be in significant amount of pain. Bilateral lower extremities are neurovascularly intact. Pain controlled with Dilaudid and consult to Dr. Jeral Fruit. He is requesting admission orders for [address] [place] and he will be admitting doctor. Patient is stable for transport to the floor.    Alveta Heimlich, PA-C 12/25/15 1426  Raeford Razor, MD 01/05/16 1318

## 2015-12-25 NOTE — ED Notes (Signed)
Discharged per dr Jeral Fruitbotero.

## 2015-12-25 NOTE — ED Notes (Signed)
Dr.Botero speaking with family. Pt and family requesting to go home instead of wait for admission. Family given prescriptions.

## 2015-12-25 NOTE — ED Notes (Signed)
This RN spoke with Dr. Jeral FruitBotero. MD informed RN that he is in surgery and will be able to see pt in approx 2 hours. For now, for pain control, give 100mcg fentanyl. MD will come speak with pt when he can. RN will notify pt and pt's family

## 2015-12-25 NOTE — ED Notes (Addendum)
Pt reports acute flair up of chronic back pain starting yesterday radiating to right leg pt reports back surgery in July and December of 2016. Pt reports Dr. Renaye RakersBatero plans to admit today. No loss of bladder or bowl function.

## 2015-12-25 NOTE — ED Notes (Signed)
Pt refusing dilaudid for pain. Pt states she wants to leave and she could be more comfortable at home. RN explained that dilaudid is the most powerful pain medication we can give at this time. Pt upset that she does not have a room upstairs at this point. RN also explained to pt that she unfortunately will have to wait for a bed,we have been holding and have been full. But RN is happy to make her comfortable while here and have food. Pt upset and states dilaudid "doesn't do shit for me." RN asked what she would prefer, pt states pain pills help her. RN notified PA to please come speak with pt and pt's husband.

## 2015-12-25 NOTE — ED Notes (Signed)
Dr. Jeral FruitBotero bedside speaking with family and pt letting them know the pt will be going home due to no hospital beds being available.

## 2015-12-25 NOTE — Progress Notes (Signed)
Patient ID: Latoya Brady, female   DOB: 01-Jul-1971, 45 y.o.   MRN: [id number] See note dictated. [facility] beds at [facility].c/o burning sensation in the right foot worse after an epidural yesterday. No weakness. Wound well healed. To go home with lyrica 50  Tid and elavil 50 mgs nite. To call my office 12/27/15 to remind us foe a pain clinic appointment

## 2015-12-26 NOTE — Consult Note (Signed)
NAMCharlestine Night:  Shugart, Shenique             ACCOUNT NO.:  [id number]  MEDICAL RECORD NO.:  [id number]  LOCATION:  [facility]                         FACILITY:  [facility]  PHYSICIAN:  Hilda LiasErnesto Evelin Cake, M.D.   DATE OF BIRTH:  12-29-1970  DATE OF CONSULTATION: DATE OF DISCHARGE:  12/25/2015                                CONSULTATION   HISTORY:  [name]' husband called me right before I was going to surgery today in the afternoon.  He was telling me that she was feeling worse that the pain got more intense after she had an epidural in the lumbar spine yesterday.  They wanted to be admitted to the hospital.  My advice was if that was the case they can go to the admitting [facility] to be straight admitted to the floor under my care. It seems that the admitting office was closed, and she was referred to the emergency room where she was seen and she was given morphine, Dilaudid.  At the end, when I finished with my surgery, I went to see her.  There is no question that she had the L5-S1 radiculopathy, sensory, there is no weakness.  Reflexes are present.  The wound is well healed.  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  I talked to the nurses and it seemed that there is [facility] available at [facility], and if the patient wants to be admitted to the hospital, had to remain in the emergency room until one comes open.  At the end, I talked to her, and daughter was there, and another lady about the pathology was going on.  I told them that this is a situation where there is no need for surgery.  The old lady asked about x-ray, I said that this is something that needs to be diagnosed clinically and a plain x-ray is not going to help because this is not question about lose screws.  We had exploration of the lumbar spine several days ago which showed mostly scar tissue.  At the end, she is going to go home, not taking gabapentin but  using Lyrica 50 mg 3 times a day and Elavil 50 mg at nighttime.  They will call me on Monday just two days from today to set up an appointment with the Pain Clinic.          ______________________________ Hilda LiasErnesto Evaristo Tsuda, M.D.     EB/MEDQ  D:  12/25/2015  T:  12/26/2015  Job:  [id number]

## 2016-10-02 IMAGING — MR MR LUMBAR SPINE WO/W CM
4 of 7 series · 24 of 48 positions shown · IV contrast (multihance)
Comparison: Lumbar MRI 07/01/2015

CLINICAL DATA: Lumbar radiculopathy. Lumbar discectomy June 2015.
Back pain with right buttock and leg pain

EXAM:
MRI LUMBAR SPINE WITHOUT AND WITH CONTRAST
TECHNIQUE: Multiplanar and multiecho pulse sequences of the lumbar spine were
obtained without and with intravenous contrast.
CONTRAST:  13mL MULTIHANCE GADOBENATE DIMEGLUMINE 529 MG/ML IV SOLN

[Series 7: T1 · sagittal · 4.0mm · 0.88mm/px · 4 of 15 slices shown (1 of 2)]
[im 1/15]
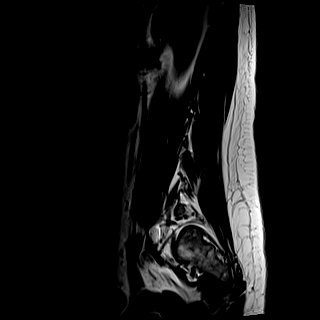
[im 5/15]
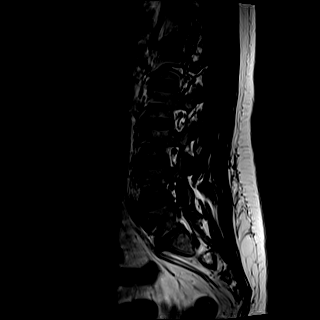
[im 10/15]
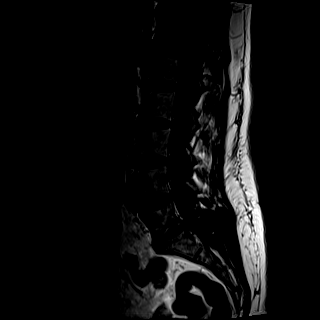
[im 15/15]
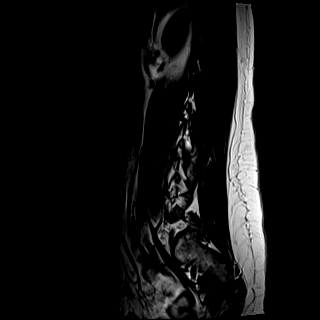

[Series 10: T2 · axial · 4.0mm · 0.28mm/px · z∈[-107,+87]mm · 8 of 33 slices shown]
[im 1/33]
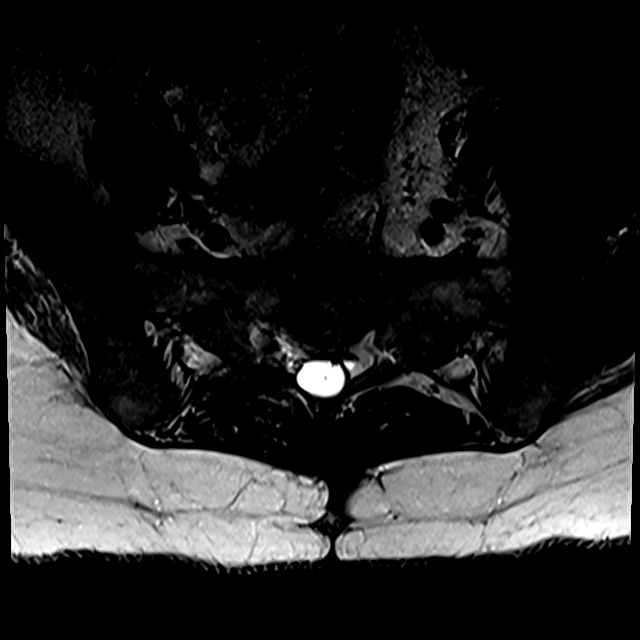
[im 4/33]
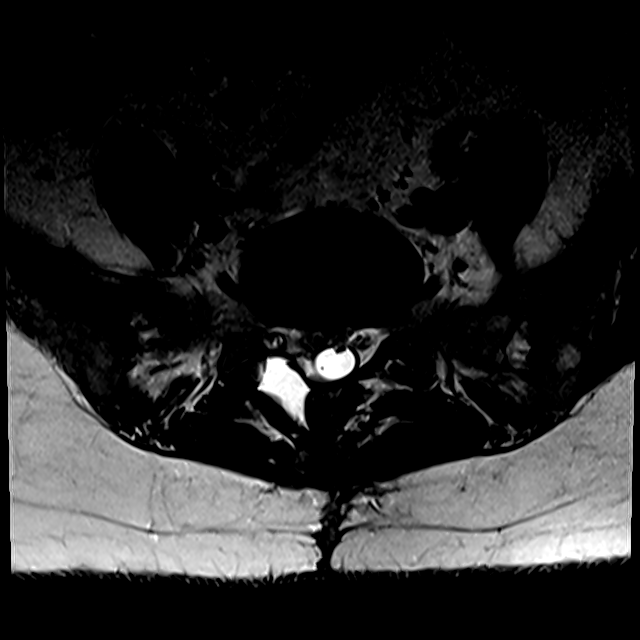
[im 11/33]
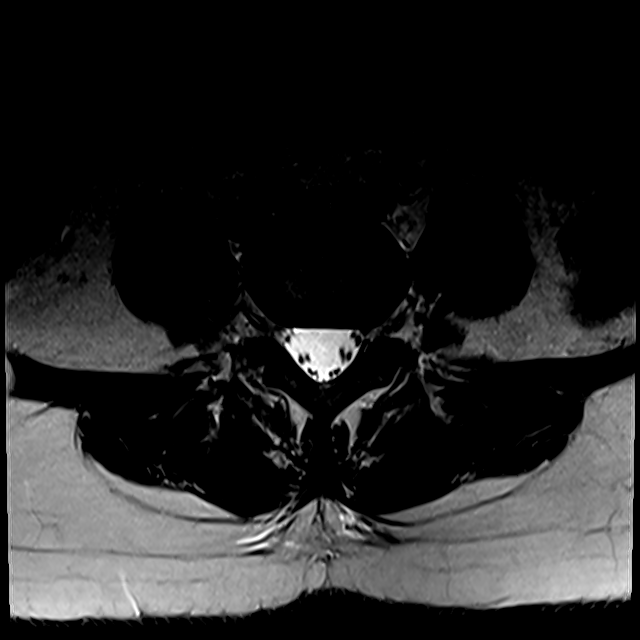
[im 15/33]
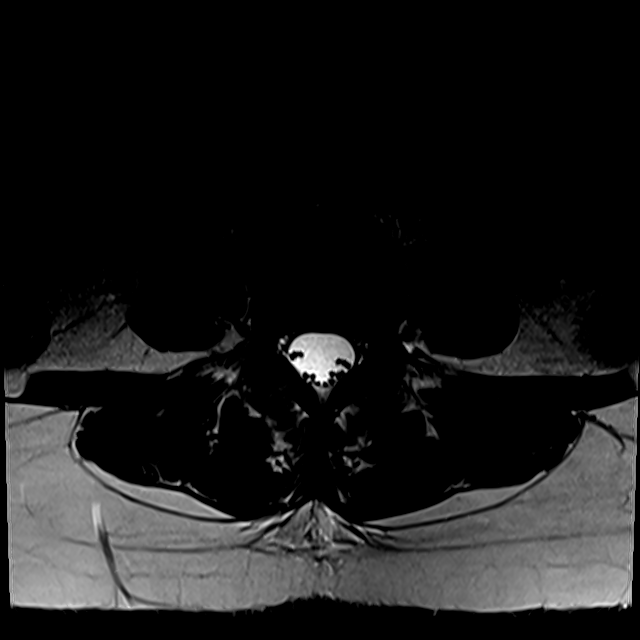
[im 18/33]
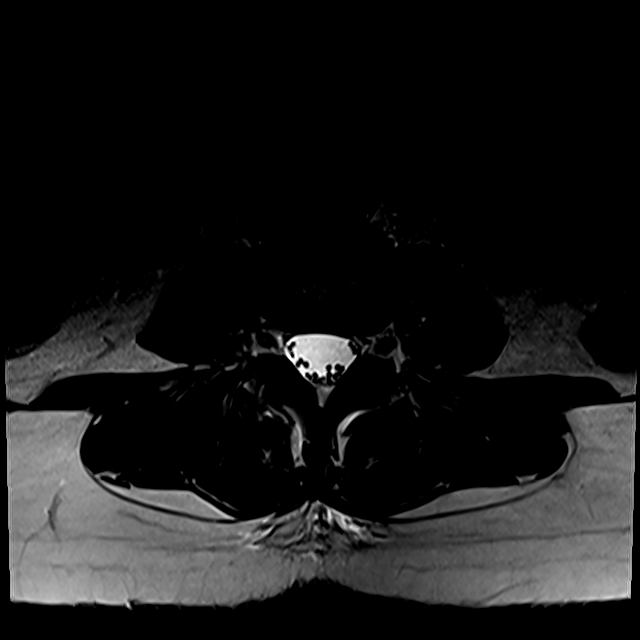
[im 22/33]
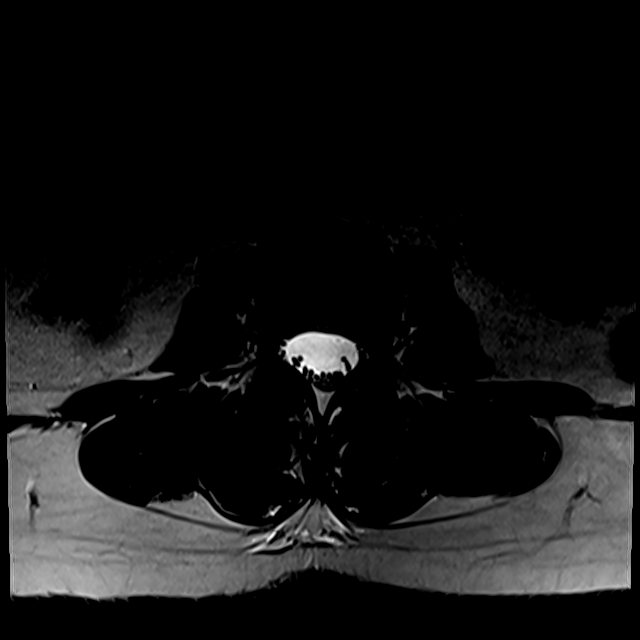
[im 29/33]
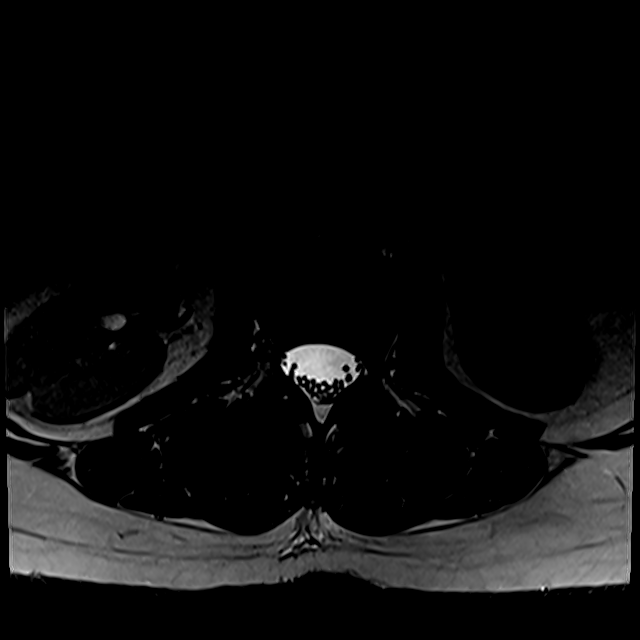
[im 33/33]
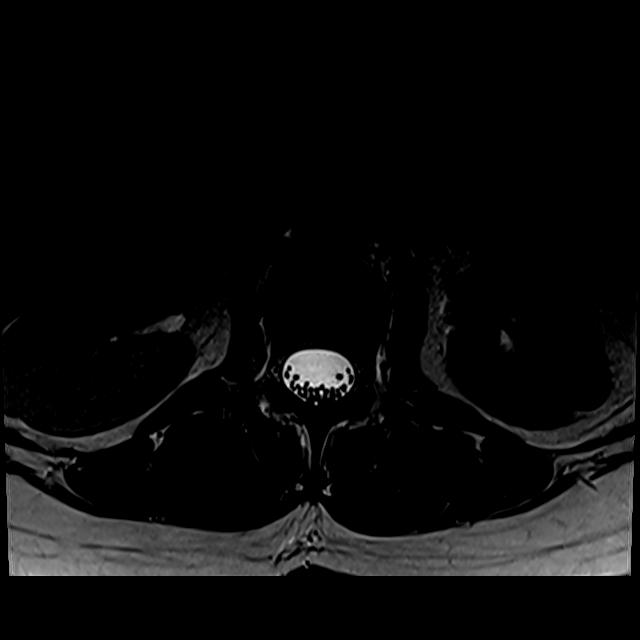

[Series 13: T1 · axial · 4.0mm · 0.56mm/px · z∈[-107,+67]mm · 7 of 33 slices shown (2 of 2)]
[im 1/33]
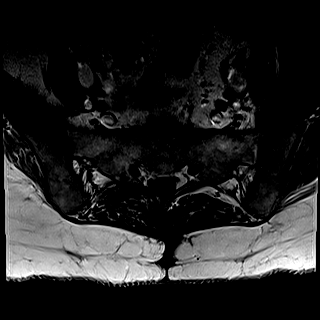
[im 4/33]
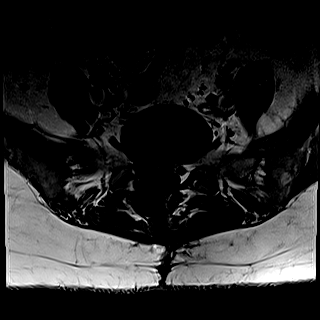
[im 11/33]
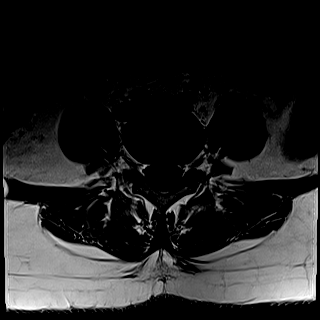
[im 15/33]
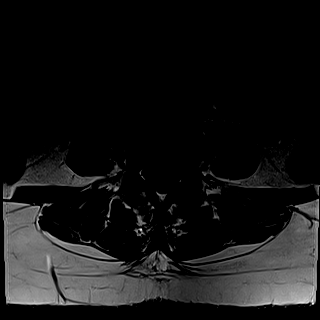
[im 18/33]
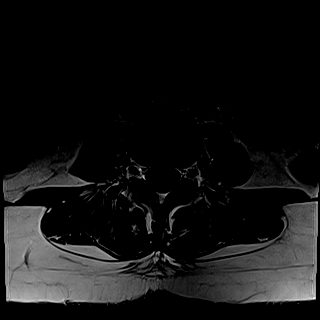
[im 22/33]
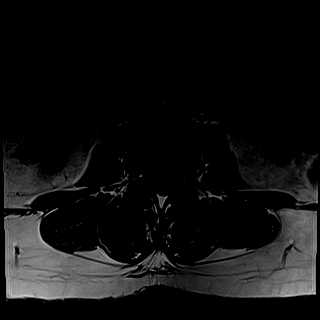
[im 29/33]
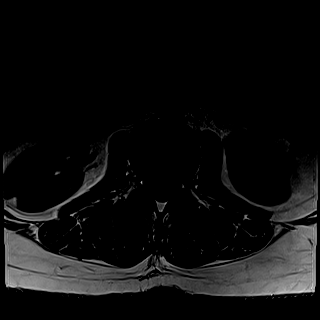

[Series 14: T2 post-contrast · sagittal · 4.0mm · 0.73mm/px · 5 of 15 slices shown]
[im 1/15]
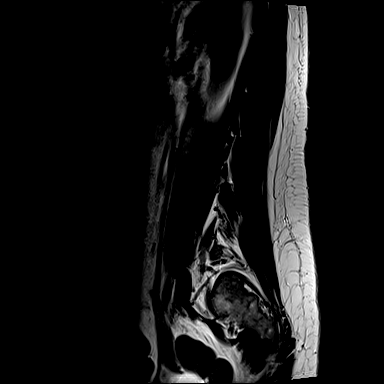
[im 4/15]
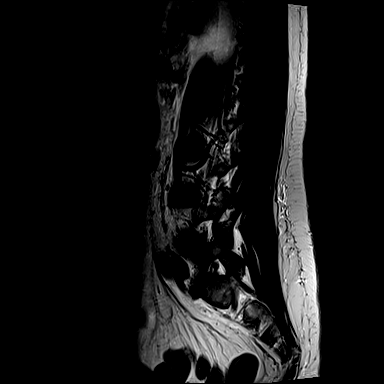
[im 8/15]
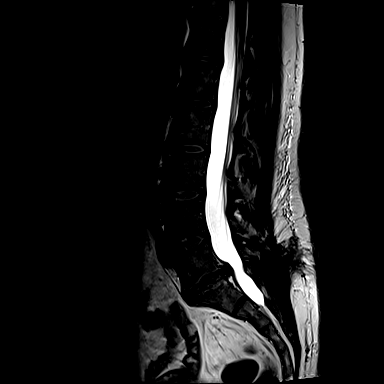
[im 11/15]
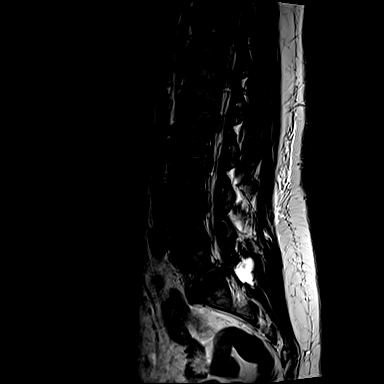
[im 15/15]
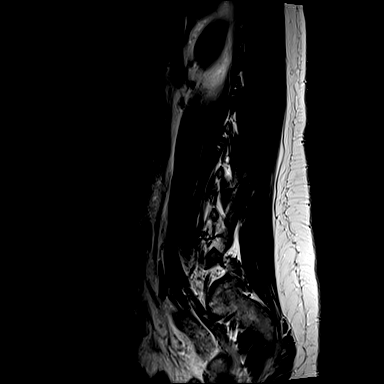

[24 of 48 positions shown; findings below may reference images not displayed]

FINDINGS: Postop discectomy on the right at L5-S1. Normal alignment. Negative
for fracture or mass. Conus medullaris normal and terminates at L1

L1-2:  Negative

L2-3:  Negative

L3-4:  Negative

L4-5:  Negative

L5-S1: Postop laminectomy and discectomy on the right. There is
enhancing epidural scar tissue on the right. There is residual
recurrent small central disc protrusion with nonenhancing epidural
soft tissue as well as mass-effect on the thecal sac. No definite
nerve root compression. There is enhancement of the right S1 nerve
root compatible with neuritis. 10 x 15 mm postop fluid collection on
the right not likely to be infection.
IMPRESSION: Postop discectomy of very large disc protrusion on the right at
L5-S1. Small residual recurrent central disc fragments are present
without significant neural compression or spinal stenosis. Small
postop fluid collection in the surgical bed.

Other disc spaces are normal.

## 2016-11-10 IMAGING — CR DG LUMBAR SPINE 1V
1 series · 1 of 1 positions shown · non-contrast
Comparison: 12/10/2015, 12/09/2015

CLINICAL DATA: Lumbar wound I and D done in Neuro OR 4. Radiation
safety time out done at [DATE]

EXAM:
LUMBAR SPINE - 1 VIEW

[lat]
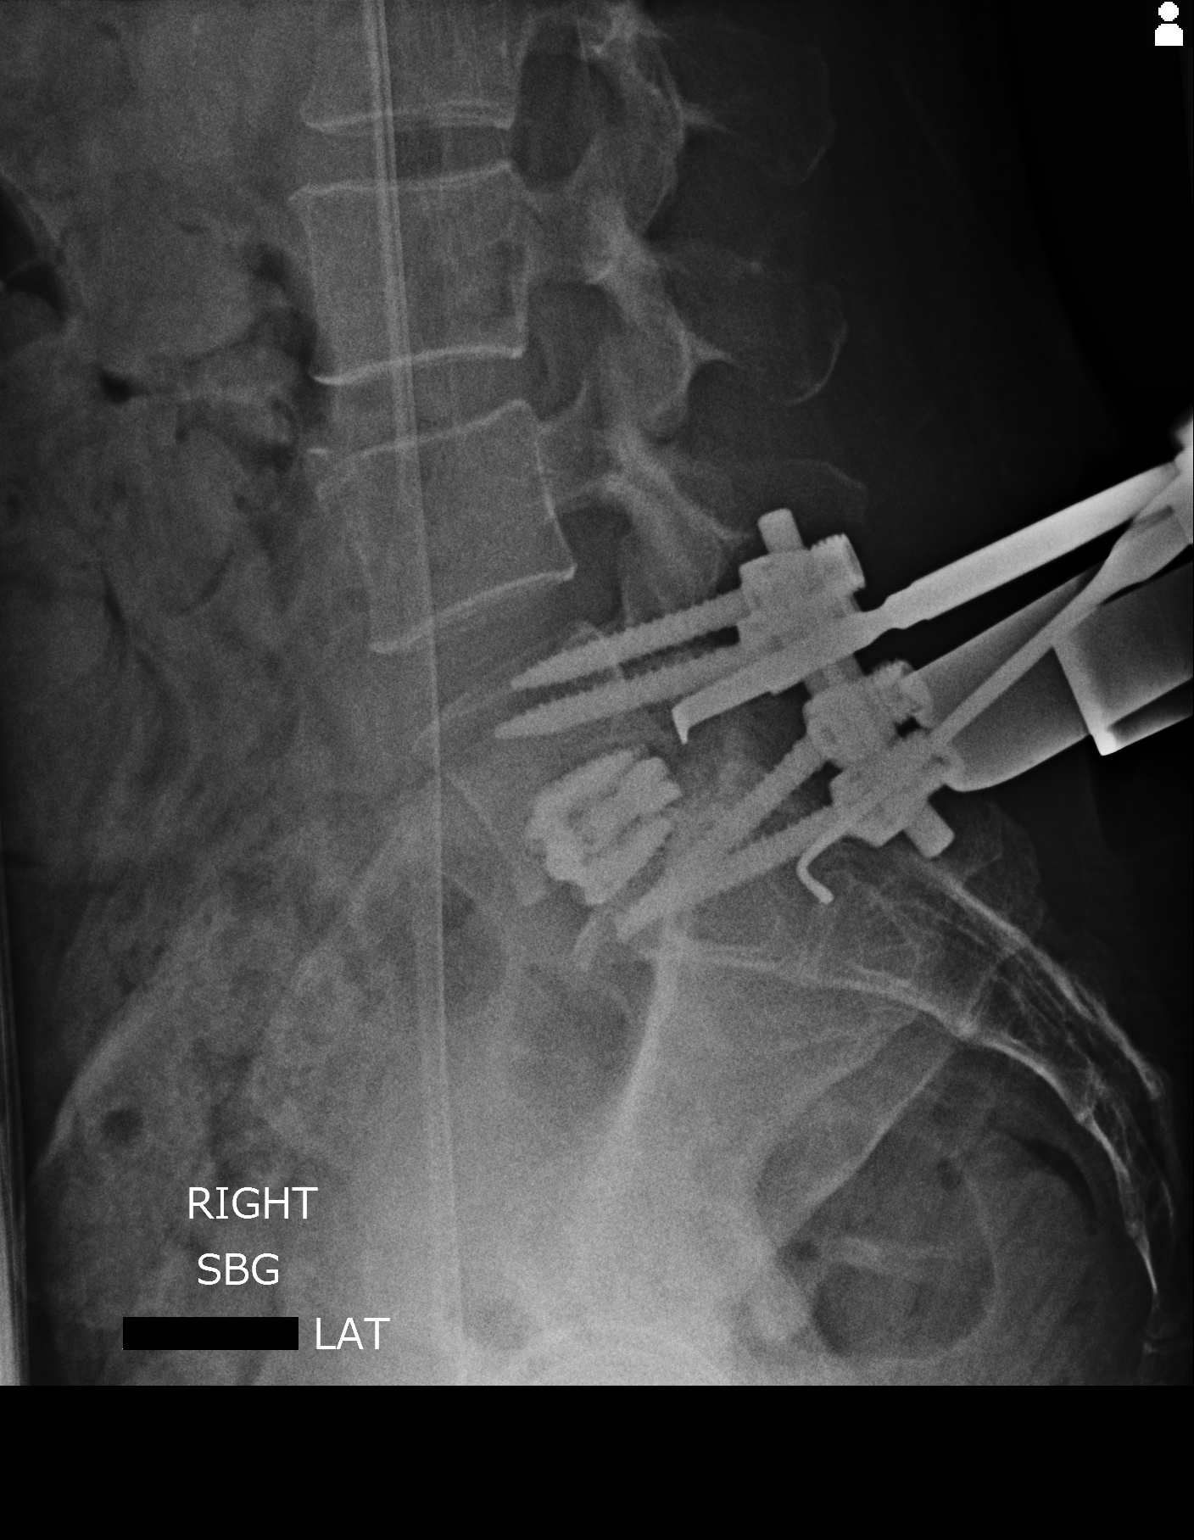

[1 of 1 positions shown; findings below may reference images not displayed]

FINDINGS: Posterior retractor is in place. patient's transpedicular screws at
L5 and S1. Interbody fusion devices identified at L5-S1. There are 2
surgical instruments along the posterior aspect of the spine, the
first overlying the posterior vertebral body at L5. The second
overlies the posterior vertebral body at S1.
IMPRESSION: Intraoperative localization.

## 2017-01-16 DIAGNOSIS — J069 Acute upper respiratory infection, unspecified: Secondary | ICD-10-CM | POA: Diagnosis not present

## 2017-01-19 DIAGNOSIS — M5116 Intervertebral disc disorders with radiculopathy, lumbar region: Secondary | ICD-10-CM | POA: Diagnosis not present

## 2017-01-19 DIAGNOSIS — M961 Postlaminectomy syndrome, not elsewhere classified: Secondary | ICD-10-CM | POA: Diagnosis not present

## 2017-01-19 DIAGNOSIS — M5431 Sciatica, right side: Secondary | ICD-10-CM | POA: Diagnosis not present

## 2017-02-19 DIAGNOSIS — M62838 Other muscle spasm: Secondary | ICD-10-CM | POA: Diagnosis not present

## 2017-02-19 DIAGNOSIS — M961 Postlaminectomy syndrome, not elsewhere classified: Secondary | ICD-10-CM | POA: Diagnosis not present

## 2017-02-19 DIAGNOSIS — M5416 Radiculopathy, lumbar region: Secondary | ICD-10-CM | POA: Diagnosis not present

## 2017-02-21 DIAGNOSIS — M5416 Radiculopathy, lumbar region: Secondary | ICD-10-CM | POA: Diagnosis not present

## 2017-03-12 DIAGNOSIS — N390 Urinary tract infection, site not specified: Secondary | ICD-10-CM | POA: Diagnosis not present

## 2017-03-12 DIAGNOSIS — E039 Hypothyroidism, unspecified: Secondary | ICD-10-CM | POA: Diagnosis not present

## 2017-03-12 DIAGNOSIS — Z131 Encounter for screening for diabetes mellitus: Secondary | ICD-10-CM | POA: Diagnosis not present

## 2017-03-12 DIAGNOSIS — Z13220 Encounter for screening for lipoid disorders: Secondary | ICD-10-CM | POA: Diagnosis not present

## 2017-03-16 ENCOUNTER — Encounter: Payer: Self-pay | Attending: Psychology | Admitting: Psychology

## 2017-03-16 ENCOUNTER — Encounter: Payer: Self-pay | Admitting: Psychology

## 2017-03-16 DIAGNOSIS — F0630 Mood disorder due to known physiological condition, unspecified: Secondary | ICD-10-CM

## 2017-03-16 DIAGNOSIS — G894 Chronic pain syndrome: Secondary | ICD-10-CM | POA: Insufficient documentation

## 2017-03-16 DIAGNOSIS — Z9889 Other specified postprocedural states: Secondary | ICD-10-CM | POA: Insufficient documentation

## 2017-03-16 DIAGNOSIS — F39 Unspecified mood [affective] disorder: Secondary | ICD-10-CM | POA: Insufficient documentation

## 2017-03-23 ENCOUNTER — Encounter: Payer: Self-pay | Admitting: Psychology

## 2017-03-23 ENCOUNTER — Telehealth: Payer: Self-pay | Admitting: Physical Medicine & Rehabilitation

## 2017-03-23 NOTE — Progress Notes (Signed)
Patient:  DAVID TOWSON   DOB: Sep 13, 1971  MR Number: [id number]  Location: [facility] FOR PAIN AND [facility] MEDICINE [facility] PHYSICAL MEDICINE AND REHABILITATION [address] [place] [id number] [place] [address] Dept: [phone]  Start: 12 PM End: 1 PM  Provider/Observer:     Hershal Coria PSYD  Chief Complaint:      Chief Complaint  Patient presents with  . Pain  . Anxiety  . Depression  . Other    Insomnia, attention and concentration deficits/weakness    Reason For Service:     The patient was referred for psychological evaluation as part of the initial workup for consideration for a spinal cord stimulator implantation trial. The patient was referred by Dr. Ollen Bowl for this evaluation. The patient describes continued pain in her buttocks as well as foot describes his pain as dull and throbbing. She reports that she's had these symptoms the past 16 months and that she has had a very difficult time finding ways to alleviate this pain. The patient reports that she had a discectomy performed in July 2016 and a second spinal surgery in December 2016. She reports that they removed the disc as well as placed hardware in her back. The patient reports that after the December 16 operation that she developed new pain including burning sensations and other pain symptoms. Return visits did not identify any particular issue that could be addressed. She is now being evaluated for consideration for a spinal cord stimulator implantation.  Testing Administered:  The patient completed the [place] multiphasic personality inventory-II as well as the patient pain profile (P3).  Participation Level:   Active  Participation Quality:  Appropriate  the patient's validity scores on both the MMPI 2 as well as the P3 were within acceptable limits and both appear to be valid representations of her current symptomatology. There were no indications that she appeared  to either exaggerate or minimize her current situations and it does appear that she approached these measures in an appropriate and straightforward manner. These results appear to be valid.    Behavioral Observation:  Well Groomed, Alert, and Appropriate.   Test Results:   Initially, the patient completed the MMPI-2. Resulting validity scales were consistent with a valid completion of this measure without indication of attempts to exaggerate or minimize symptomatology. The resulting clinical scales do identify a significant elevation for symptoms of depression as well as specific descriptions of pain and other medical issues. There were no other clinical scales in the clinically significant range. She denied overwhelming symptoms of anxiety, delusions or hallucinations, or other significant psychiatric issues. There were no indications of situations such as bipolar disorder or significant psychiatric illness. In-depth analysis of her symptoms of depression utilizing Harris lingo scales indicate that the primary aspects of her depressive symptoms are related to physical difficulties and physical malfunction primarily. There are also issues related to mental dullness consistent with her descriptions of attention and concentration difficulties and short-term memory issues as well as frustration in dealing helpless and hopeless about feeling in coping with her physical difficulties. She also identifies a moderate level of fatigue and malaise. The patient does also acknowledge a significant amount of social alienation that is likely a secondary impact of her significant pain symptoms. Supplemental and content scales suggest that the primary coping mechanism she is utilizing to do with this chronic pain and loss of function has to do with regression and trying to avoid dealing with all of  the impacted her difficulties of having as well as social withdrawal. Items associated with symptoms indicative of alcohol or other  substance abuse were well within normal limits and there was no suggestion on the MMPI-2 that she was vulnerable to substance abuse in a significant way. Both of the PTSD supplemental scales were below clinical significance.  The patient also completed the patient pain profile which is a measurement that was normed on specific pain patient population. The patient scores relate related to validity items falls well within the acceptable range. All 3 of the specific indices of the measure fall within compared to samples of a community-based sample/normative sample. The patient was below measures of depression, anxiety or somatizations symptoms of a pain patient population. The patient was above the mean score for a normative sample/non-pain sample for symptoms of depression and somatic complaints but they were below the mean for the pain patient population. This pattern suggests that the level of anxiety and depression at the patient experiences is not indicative of pre-existing major depression or anxiety disorders and are more specifically related to common sequela of chronic pain syndrome.  Summary of Results:   The results of the current psychological evaluation do suggest that the patient is a very good candidate for spinal cord stimulator trialing and possible implantation. There were no indications of significant psychiatric or psychological disturbance that should have any significant negative impact on her ability to perform or understand restrictions and/or expectations during this trial procedure and that she does clearly appear to be able to follow implantation protocol. The patient demonstrated a good understanding of potential risks and benefits of this procedures as well as possible limitations from this procedure. The patient's clinical profile and symptoms of depression and anxiety do appear to be quite consistent with those typically found with chronic pain patients who were not suffering from any  significant psychological or psychiatric illness. There are no indications of vulnerability to substance abuse or alcoholism and there are no indications of prior issues of posttraumatic stress disorder or chronic personality disorder. She also does not appear to be dealing with significant somatizations or somatoform disorder.  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 Insomnia, frustration, and chronic pain are having a significant impact on her life and social functioning but these are all consistent with patterns we see with chronic pain patients and not indicative of some greater psychological or psychiatric issue.  Diagnosis:    Axis I: Chronic pain syndrome  Mood disorder due to a general medical condition   Arley Phenix, Psy.D. Neuropsychologist

## 2017-03-23 NOTE — Progress Notes (Signed)
Neuropsychological Consultation   Patient:   Latoya Brady   DOB:   Oct 05, 1971  MR Number:  [id number]  Location:  [facility] FOR PAIN AND REHABILITATIVE MEDICINE [facility] PHYSICAL MEDICINE AND REHABILITATION [address], [place] [address] [id number] [place] [address] Dept: [phone]           Date of Service:   03/16/2017  Start Time:   10 AM End Time:   11 AM  Provider/Observer:  Arley Phenix, Psy.D.       Clinical Neuropsychologist       Billing Code/Service: Psychological diagnostic evaluation  Chief Complaint:    The patient describes chronic pain and difficulties as well as mood changes related to moderate symptoms of depression, anxiety, insomnia, anxiety and worry, cognitive difficulties associated with attention and concentration and short-term memory around anxiety and worry.  Reason for Service:  The patient was referred for psychological evaluation as part of the initial workup for consideration for a spinal cord stimulator implantation trial. The patient was referred by Dr. Ollen Bowl for this evaluation. The patient describes continued pain in her buttocks as well as foot describes his pain as dull and throbbing. She reports that she's had these symptoms the past 16 months and that she has had a very difficult time finding ways to alleviate this pain. The patient reports that she had a discectomy performed in July 2016 and a second spinal surgery in December 2016. She reports that they removed the disc as well as placed hardware in her back. The patient reports that after the December 16 operation that she developed new pain including burning sensations and other pain symptoms. Return visits did not identify any particular issue that could be addressed. She is now being evaluated for consideration for a spinal cord stimulator implantation.  Current Status:  The patient describes significant pain and other throbbing pain sensations in her buttock and  foot she reports that the level of pain is significant issues as well including difficulty working, secondary mood changes consistent with depression and anxiety secondary to her general medical status, irritability and agitation, anxiety and depressive symptoms and fatigue and low energy. Patient denies any intent to harm or developing any specific plan to harm herself but she does acknowledge that when her pain is most severe she does have intrusive thoughts about ways that she could escape from the pain and these do include thoughts of self-harm.  Reliability of Information: Information is derived from one hour clinical interview with the patient as well as review of available medical records.  Behavioral Observation: Latoya Brady  presents as a 46 y.o.-year-old Right handed Female who appeared her stated age. her dress was Appropriate and she was Well Groomed and her manners were Appropriate to the situation.  her participation was indicative of Appropriate and Attentive behaviors.  There were physical disabilities noted consistent with significant pain sensations.  she displayed an appropriate level of cooperation and motivation.     Interactions:    Active Appropriate and Attentive  Attention:   The patient did appear to be distracted by some internal worries and thoughts but was able to adequately maintain attention overall. and attention span and concentration were age appropriate  Memory:   within normal limits; recent and remote memory intact  Visuo-spatial:  within normal limits  Speech (Volume):  normal  Speech:   normal; no slurring or garbling of speech.  Thought Process:  Coherent  Though Content:  WNL; patient denied any  significant intrusive thoughts related to self-harm but did acknowledge that there were times where she had intrusive thoughts about self-harm when her pain was most significant.  Orientation:   person, place, time/date and  situation  Judgment:   Good  Planning:   Good  Affect:    Anxious and Depressed  Mood:    Anxious and Depressed  Insight:   Good  Intelligence:   normal  Marital Status/Living: The patient was born in [place]. She grew up in [facility] [place]. Her parents are divorced. Her father is deceased and her mother lives in [place] and she sees her every week or 2. All the patient is married and has a 4 year old daughter and a 57 year old son.  Current Employment: The patient has been employed by [employer] for the past 20 years  Substance Use:  No concerns of substance abuse are reported.  The patient denies any issues related to substance abuse. She does acknowledge smoking approximately 1 pack of cigarettes per day for the past 30 years.  Education:   HS Graduate  Medical History:   Past Medical History:  Diagnosis Date  . Chronic back pain   . Hypothyroidism             Psychiatric History:  The patient denies any prior psychiatric history and there does not appear to be a pre-existing history of depression or anxiety disorders. She does acknowledge symptoms consistent with depression and anxiety that are commonly seen at this level with chronic pain and loss of functioning. She acknowledges times when her pain is most severe that she has intrusive thoughts around self-harm that are likely and are consistent with her descriptions as being related to a desire to escape from the situation that she feels that she has little control over.  Family Med/Psych History: The patient reports that she has a maternal aunt some cousins who have dealt with depression as well as some alcohol abuse but no other significant family psychiatric history.  Risk of Suicide/Violence: low the patient does acknowledge intrusive thoughts at times but denies developing a plan or intent to take her own life. She denies any homicidal ideation.   Impression/DX:  The patient  is a 46 year old female who is referred for psychological evaluation as part of the initial workup for consideration of spinal cord stimulator implantation trial. The patient has had 2 spinal surgeries in July but continues to have significant burning and pain sensations down her buttock and leg. This severe pain is causing significant sleep disturbance, difficulty working, and changes in mood and difficulties with attention and concentration/short-term memory.   Disposition/Plan:  The patient will complete objective psychological assessment tools including the [place] multiphasic personality inventory-II as well as the patient pain profile inventory (P3). We will complete formal evaluation and report with the completion of these measures.  Diagnosis:    Chronic pain syndrome  Mood disorder due to a general medical condition         Electronically Signed   _______________________ Arley Phenix, Psy.D.

## 2017-04-20 ENCOUNTER — Telehealth: Payer: Self-pay | Admitting: *Deleted

## 2017-04-20 NOTE — Telephone Encounter (Signed)
[facility] [employer] from [facility] sent to scheduling

## 2017-04-24 DIAGNOSIS — M961 Postlaminectomy syndrome, not elsewhere classified: Secondary | ICD-10-CM | POA: Diagnosis not present

## 2017-04-24 DIAGNOSIS — G894 Chronic pain syndrome: Secondary | ICD-10-CM | POA: Diagnosis not present

## 2017-05-09 ENCOUNTER — Ambulatory Visit: Payer: Self-pay | Admitting: Nurse Practitioner

## 2017-05-11 ENCOUNTER — Telehealth: Payer: Self-pay

## 2017-05-11 NOTE — Telephone Encounter (Signed)
SENT NOTES TO SCHEDULING 

## 2017-05-29 NOTE — Progress Notes (Signed)
Cardiology Office Note   Date:  05/31/2017   ID:  Latoya Brady, DOB [birth year], MRN [id number]  PCP:  Juluis RainierBarnes, Elizabeth, MD  Cardiologist:   Charlton HawsPeter Tameria Patti, MD   No chief complaint on file.     History of Present Illness: Latoya Brady is a 46 y.o. female who presents for Apparently patient needs functional capacity evaluation for disability determination. Dr Zachery DauerBarnes did not feel like she could provide clearance for "intense testing "  Needed for determination She has chronic pain syndrome anxiety and depression has had psychosocial evaluation for spinal cord stimulator Back pain issues started back in 2016 with discectomy surgery   She has no cardiac or vascular history   She was to start rehab and BP was high so they would not let her participate BP been higher last 2 years since back issues. Know known vascular disease She denies SSCP palpitations syncope Activity limited by LLE pain and numbness    Past Medical History:  Diagnosis Date  . Back pain 12/25/2015  . Chronic back pain   . Hypothyroidism   . Lumbar radiculopathy 12/11/2015  . 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 Recurrent low back pain 12/10/2015    Past Surgical History:  Procedure Laterality Date  . ABDOMINAL HYSTERECTOMY    . BACK SURGERY    . CARPAL TUNNEL RELEASE Bilateral   . LUMBAR WOUND DEBRIDEMENT N/A 12/11/2015   Procedure: EXPLORATION OF LUMBAR SEROMA;  Surgeon: Hilda LiasErnesto Botero, MD;  Location: [facility];  Service: Neurosurgery;  Laterality: N/A;     Current Outpatient Prescriptions  Medication Sig Dispense Refill  . diazepam (VALIUM) 5 MG tablet Take 1 tablet (5 mg total) by mouth every 6 (six) hours as needed for muscle spasms. 60 tablet 0  . levothyroxine (SYNTHROID, LEVOTHROID) 75 MCG tablet Take 75 mcg by mouth daily before breakfast.    . LYRICA 150 MG capsule Take 150 mg by mouth 3 (three) times daily.  2  . meloxicam (MOBIC) 7.5 MG tablet Take 7.5 mg by mouth 2  (two) times daily.  0  . polyethylene glycol (MIRALAX / GLYCOLAX) packet Take 17 g by mouth daily as needed for moderate constipation.    Marland Kitchen. losartan (COZAAR) 25 MG tablet Take 1 tablet (25 mg total) by mouth daily. 90 tablet 3   No current facility-administered medications for this visit.     Allergies:   Macrobid [nitrofurantoin monohyd macro]    Social History:  The patient  reports that she has been smoking Cigarettes.  She has a 18.75 pack-year smoking history. She has never used smokeless tobacco. She reports that she does not drink alcohol or use drugs.   Family History:  The patient's family history is not on file.    ROS:  Please see the history of present illness.   Otherwise, review of systems are positive for none.   All other systems are reviewed and negative.    PHYSICAL EXAM: VS:  BP (!) 144/88 (BP Location: Left Arm)   Pulse 100   Ht 5\' 2"  (1.575 m)   Wt 75.7 kg (166 lb 12.8 oz)   LMP  (LMP Unknown)   BMI 30.51 kg/m  , BMI Body mass index is 30.51 kg/m. Affect appropriate Healthy:  appears stated age HEENT: normal Neck supple with no adenopathy JVP normal no bruits no thyromegaly Lungs clear with no wheezing and good diaphragmatic motion Heart:  S1/S2 no murmur, no rub, gallop or click PMI  normal Abdomen: benighn, BS positve, no tenderness, no AAA no bruit.  No HSM or HJR Distal pulses intact with no bruits No edema Neuro non-focal Skin warm and dry No muscular weakness    EKG:  NSR normal ECG    Recent Labs: No results found for requested labs within last 8760 hours.    Lipid Panel No results found for: CHOL, TRIG, HDL, CHOLHDL, VLDL, LDLCALC, LDLDIRECT    Wt Readings from Last 3 Encounters:  05/31/17 75.7 kg (166 lb 12.8 oz)  12/10/15 64.9 kg (143 lb)  11/16/15 65.3 kg (143 lb 15.4 oz)      Other studies Reviewed: Additional studies/ records that were reviewed today include: Notes Dr Zachery Dauer labs And neurology notes Dr  Jeral Fruit.    ASSESSMENT AND PLAN:  1.  Clearance:  For rehab ETT ordered  2. Chronic Pain Syndrome pain meds with Dr Zachery Dauer and rehab should help 3. Thyroid  On replacement TSH normal 4. Smoking: has not had recent CXR  Ordered normal exam counseled for less than 10 minutes on cessation 5. BP:  Start cozaar 25mg   And f/u with primary assess BP response to exercise   Current medicines are reviewed at length with the patient today.  The patient does not have concerns regarding medicines.  The following changes have been made:  no change  Labs/ tests ordered today include: ETT, CXR  Orders Placed This Encounter  Procedures  . DG Chest 2 View  . EXERCISE TOLERANCE TEST  . EKG 12-Lead     Disposition:   FU with cardiology PRN      Signed, Charlton Haws, MD  05/31/2017 1:26 PM    [facility] [address] [place], [place], [place]  [address] Phone: [phone]; Fax: [phone]

## 2017-05-30 DIAGNOSIS — M961 Postlaminectomy syndrome, not elsewhere classified: Secondary | ICD-10-CM | POA: Diagnosis not present

## 2017-05-30 DIAGNOSIS — M5416 Radiculopathy, lumbar region: Secondary | ICD-10-CM | POA: Diagnosis not present

## 2017-05-30 DIAGNOSIS — G894 Chronic pain syndrome: Secondary | ICD-10-CM | POA: Diagnosis not present

## 2017-05-31 ENCOUNTER — Encounter: Payer: Self-pay | Admitting: Cardiovascular Disease

## 2017-05-31 ENCOUNTER — Encounter (INDEPENDENT_AMBULATORY_CARE_PROVIDER_SITE_OTHER): Payer: Self-pay

## 2017-05-31 ENCOUNTER — Ambulatory Visit (INDEPENDENT_AMBULATORY_CARE_PROVIDER_SITE_OTHER): Payer: 59 | Admitting: Cardiovascular Disease

## 2017-05-31 ENCOUNTER — Ambulatory Visit
Admission: RE | Admit: 2017-05-31 | Discharge: 2017-05-31 | Disposition: A | Discharge status: Home / Self Care | Payer: 59 | Source: Ambulatory Visit | Attending: Cardiovascular Disease | Admitting: Cardiovascular Disease

## 2017-05-31 VITALS — BP 144/88 | HR 100 | Ht 62.0 in | Wt 166.8 lb

## 2017-05-31 DIAGNOSIS — F17200 Nicotine dependence, unspecified, uncomplicated: Secondary | ICD-10-CM | POA: Diagnosis not present

## 2017-05-31 DIAGNOSIS — Z01818 Encounter for other preprocedural examination: Secondary | ICD-10-CM

## 2017-05-31 DIAGNOSIS — I10 Essential (primary) hypertension: Secondary | ICD-10-CM | POA: Diagnosis not present

## 2017-05-31 MED ORDER — LOSARTAN POTASSIUM 25 MG PO TABS: 25.0000 mg | ORAL_TABLET | Freq: Every day | ORAL | 3 refills | Status: DC

## 2017-05-31 NOTE — Patient Instructions (Signed)
Medication Instructions:  Your physician has recommended you make the following change in your medication:  1-START Cozaar 25 mg by mouth daily  Labwork: NONE  Testing/Procedures: A chest x-ray takes a picture of the organs and structures inside the chest, including the heart, lungs, and blood vessels. This test can show several things, including, whether the heart is enlarges; whether fluid is building up in the lungs; and whether pacemaker / defibrillator leads are still in place.  Your physician has requested that you have an exercise tolerance test. For further information please visit [email or web address]. Please also follow instruction sheet, as given.  Follow-Up: Your physician wants you to follow-up as needed with Dr. Eden EmmsNishan.   If you need a refill on your cardiac medications before your next appointment, please call your pharmacy.

## 2017-06-05 ENCOUNTER — Ambulatory Visit (INDEPENDENT_AMBULATORY_CARE_PROVIDER_SITE_OTHER): Payer: 59

## 2017-06-05 DIAGNOSIS — Z01818 Encounter for other preprocedural examination: Secondary | ICD-10-CM

## 2017-06-05 LAB — EXERCISE TOLERANCE TEST
CHL CUP MPHR: 173 {beats}/min
CHL CUP RESTING HR STRESS: 78 {beats}/min
CHL RATE OF PERCEIVED EXERTION: 16
Estimated workload: 8.3 METS
Exercise duration (min): 7 min
Exercise duration (sec): 31 s
Peak HR: 157 {beats}/min
Percent HR: 90 %

## 2017-09-21 DIAGNOSIS — R300 Dysuria: Secondary | ICD-10-CM | POA: Diagnosis not present

## 2017-09-21 DIAGNOSIS — N390 Urinary tract infection, site not specified: Secondary | ICD-10-CM | POA: Diagnosis not present

## 2017-10-11 DIAGNOSIS — Z720 Tobacco use: Secondary | ICD-10-CM | POA: Diagnosis not present

## 2017-10-11 DIAGNOSIS — N390 Urinary tract infection, site not specified: Secondary | ICD-10-CM | POA: Diagnosis not present

## 2017-12-25 DIAGNOSIS — R030 Elevated blood-pressure reading, without diagnosis of hypertension: Secondary | ICD-10-CM | POA: Diagnosis not present

## 2017-12-25 DIAGNOSIS — M62838 Other muscle spasm: Secondary | ICD-10-CM | POA: Diagnosis not present

## 2017-12-25 DIAGNOSIS — M5416 Radiculopathy, lumbar region: Secondary | ICD-10-CM | POA: Diagnosis not present

## 2018-03-07 DIAGNOSIS — N390 Urinary tract infection, site not specified: Secondary | ICD-10-CM | POA: Diagnosis not present

## 2018-03-07 DIAGNOSIS — E039 Hypothyroidism, unspecified: Secondary | ICD-10-CM | POA: Diagnosis not present

## 2018-03-07 DIAGNOSIS — G894 Chronic pain syndrome: Secondary | ICD-10-CM | POA: Diagnosis not present

## 2018-04-12 DIAGNOSIS — J0140 Acute pansinusitis, unspecified: Secondary | ICD-10-CM | POA: Diagnosis not present

## 2018-04-12 NOTE — Progress Notes (Signed)
 Subjective:  Patient ID: Latoya Brady is a 47 y.o. female. HPI    Nasal Congestion    The patient reports  moderate pain.  This problem has existed for  14 days.  This problem  occurs constantly.  Since onset, the problem has been  gradually worsening.  The problem is  new.  Ineffective treatments include  cough/cold medication and decongestants.  Associated symptoms include  ear pain, headaches, facial fullness, tooth pain, sinus pain, nasal congestion, fatigue and cough.  Negative for halitosis, swollen glands, rhinorrhea, shortness of breath, vertigo, decreased sense of smell, fever, purulent nasal discharge and sore throat.  PMH includes Negative for immunocompromised and allergies .       Last edited by Recardo Loll, NP on 04/12/2018  2:37 PM. (History)     ROS    Positive for: Constitutional, HENT   Last edited by Recardo Loll, NP on 04/12/2018  2:37 PM. (History)      Social History   Tobacco Use  Smoking Status Current Every Day Smoker  . Packs/day: 1.00  Smokeless Tobacco Never Used   Past Medical History:  Diagnosis Date  . Disease of thyroid  gland   . Sciatica    Past Surgical History:  Procedure Laterality Date  . HYSTERECTOMY    . SPINE SURGERY     History reviewed. No pertinent family history. Objective: Physical Exam  Constitutional: Vital signs are normal. She appears well-developed and well-nourished. No distress.  HENT:  Head: Normocephalic and atraumatic.  Right Ear: Hearing, external ear and ear canal normal. Tympanic membrane is bulging.  Left Ear: Hearing, tympanic membrane, external ear and ear canal normal.  Nose: Right sinus exhibits maxillary sinus tenderness and frontal sinus tenderness. Left sinus exhibits no maxillary sinus tenderness and no frontal sinus tenderness.  Mouth/Throat: Uvula is midline, oropharynx is clear and moist and mucous membranes are normal.  Neck: Normal range of motion. Neck supple.  Cardiovascular: Normal rate,  regular rhythm, S1 normal, S2 normal and normal heart sounds. Exam reveals no gallop and no friction rub.  No murmur heard. Pulmonary/Chest: Effort normal and breath sounds normal. No respiratory distress. She has no decreased breath sounds. She has no wheezes. She has no rhonchi. She has no rales.  Skin: Skin is warm and dry. She is not diaphoretic.  Psychiatric: Her speech is normal and behavior is normal.     Assessment/Plan:   Encounter Diagnosis  Name Primary?  . Acute non-recurrent pansinusitis Yes   Clinical presentation significant for bacterial infection.  Antibiotics prescribed in accordance with [facility] guidelines.  Requested Prescriptions   Signed Prescriptions Disp Refills  . amoxicillin-clavulanate (AUGMENTIN) 875-125 mg tablet 20 tablet 0    Sig: Take 1 tablet by mouth 2 (two) times a day for 10 days.

## 2018-04-24 DIAGNOSIS — R062 Wheezing: Secondary | ICD-10-CM | POA: Diagnosis not present

## 2018-04-24 DIAGNOSIS — R05 Cough: Secondary | ICD-10-CM | POA: Diagnosis not present

## 2018-04-24 DIAGNOSIS — J209 Acute bronchitis, unspecified: Secondary | ICD-10-CM | POA: Diagnosis not present

## 2018-05-01 IMAGING — CR DG CHEST 2V
2 series · 2 of 2 positions shown · non-contrast
Comparison: Report of a chest x-ray dated April 03, 1996.

CLINICAL DATA: Hypertension, current smoker.

EXAM:
CHEST  2 VIEW

[w chest pa]
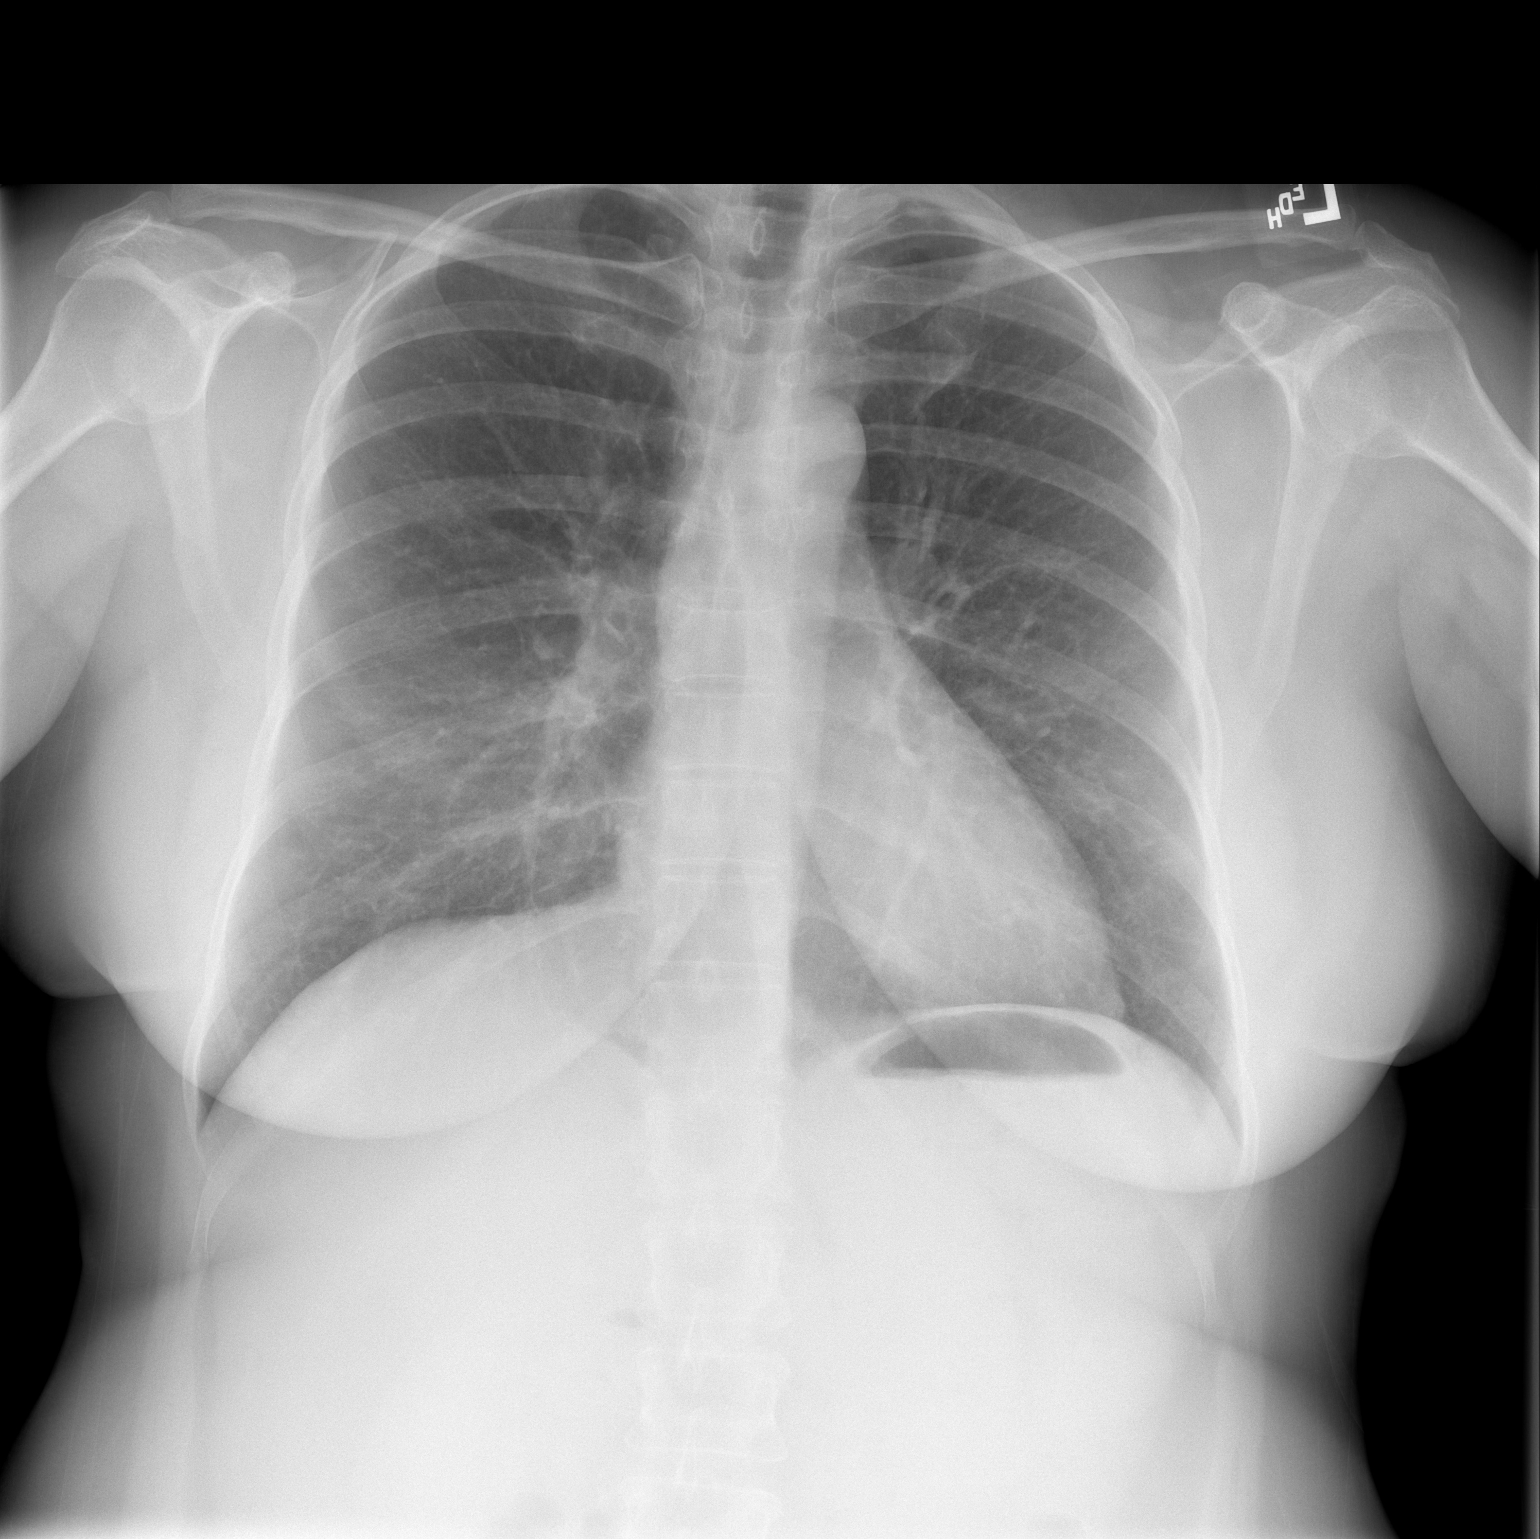

[w chest lat]
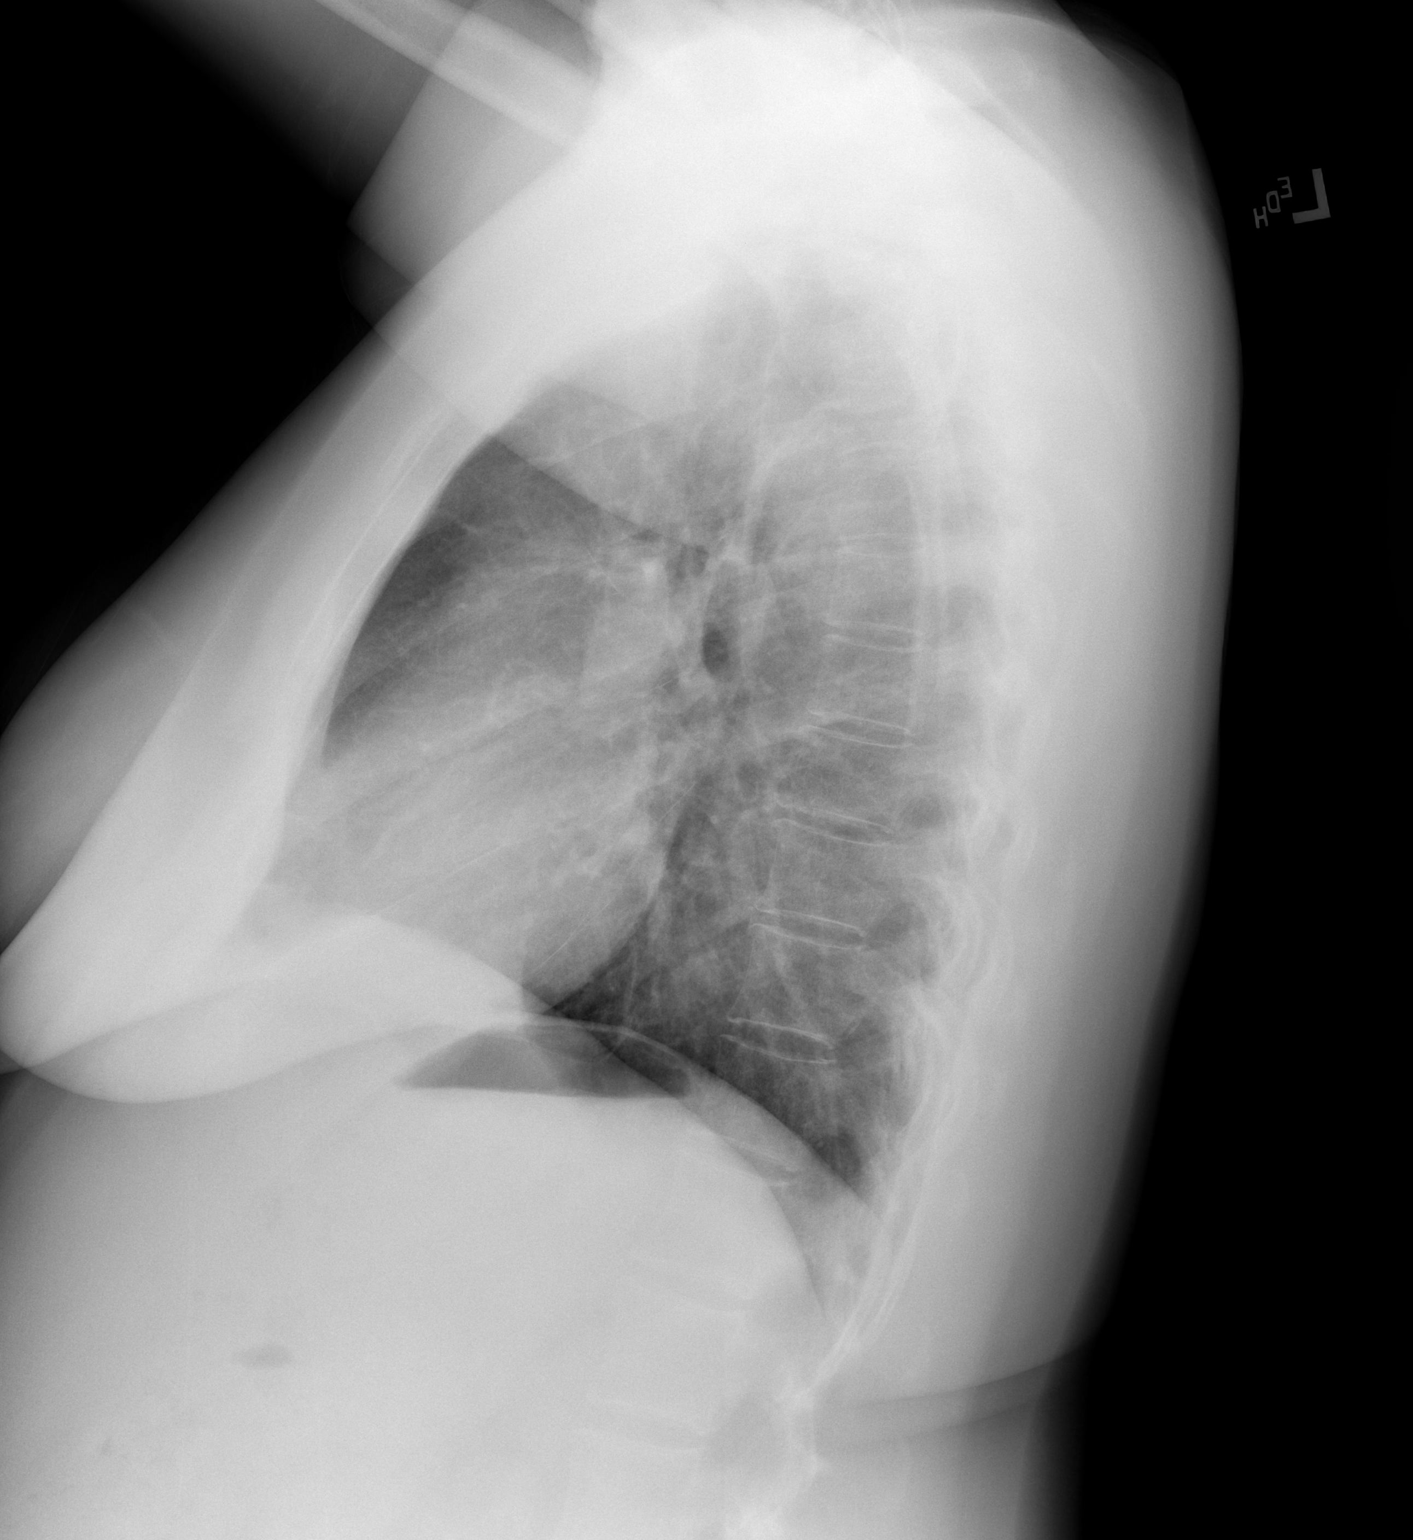

[2 of 2 positions shown; findings below may reference images not displayed]

FINDINGS: The lungs are adequately inflated. There is no focal infiltrate. No
pulmonary parenchymal nodules or masses are observed. The heart and
pulmonary vascularity are normal. The mediastinum is normal in
width. There is no pleural effusion. The bony thorax is
unremarkable.
IMPRESSION: There is no acute cardiopulmonary abnormality.

## 2018-11-04 DIAGNOSIS — G894 Chronic pain syndrome: Secondary | ICD-10-CM | POA: Diagnosis not present

## 2018-11-04 DIAGNOSIS — R05 Cough: Secondary | ICD-10-CM | POA: Diagnosis not present

## 2018-11-04 DIAGNOSIS — E039 Hypothyroidism, unspecified: Secondary | ICD-10-CM | POA: Diagnosis not present

## 2018-12-31 DIAGNOSIS — G629 Polyneuropathy, unspecified: Secondary | ICD-10-CM | POA: Diagnosis not present

## 2018-12-31 DIAGNOSIS — M5441 Lumbago with sciatica, right side: Secondary | ICD-10-CM | POA: Diagnosis not present

## 2018-12-31 DIAGNOSIS — Z79899 Other long term (current) drug therapy: Secondary | ICD-10-CM | POA: Diagnosis not present

## 2018-12-31 DIAGNOSIS — G8929 Other chronic pain: Secondary | ICD-10-CM | POA: Diagnosis not present

## 2019-01-08 DIAGNOSIS — M5441 Lumbago with sciatica, right side: Secondary | ICD-10-CM | POA: Diagnosis not present

## 2019-01-08 DIAGNOSIS — G629 Polyneuropathy, unspecified: Secondary | ICD-10-CM | POA: Diagnosis not present

## 2019-01-08 DIAGNOSIS — G8929 Other chronic pain: Secondary | ICD-10-CM | POA: Diagnosis not present

## 2019-01-22 DIAGNOSIS — M5441 Lumbago with sciatica, right side: Secondary | ICD-10-CM | POA: Diagnosis not present

## 2019-01-22 DIAGNOSIS — G629 Polyneuropathy, unspecified: Secondary | ICD-10-CM | POA: Diagnosis not present

## 2019-01-22 DIAGNOSIS — G8929 Other chronic pain: Secondary | ICD-10-CM | POA: Diagnosis not present

## 2019-01-22 DIAGNOSIS — Z79899 Other long term (current) drug therapy: Secondary | ICD-10-CM | POA: Diagnosis not present

## 2019-03-26 DIAGNOSIS — N3000 Acute cystitis without hematuria: Secondary | ICD-10-CM | POA: Diagnosis not present

## 2019-09-09 ENCOUNTER — Other Ambulatory Visit (HOSPITAL_COMMUNITY)
Admission: RE | Admit: 2019-09-09 | Discharge: 2019-09-09 | Disposition: A | Discharge status: Home / Self Care | Payer: 59 | Source: Ambulatory Visit | Attending: Family Medicine | Admitting: Family Medicine

## 2019-09-09 ENCOUNTER — Other Ambulatory Visit: Payer: Self-pay | Admitting: Family Medicine

## 2019-09-09 DIAGNOSIS — Z124 Encounter for screening for malignant neoplasm of cervix: Secondary | ICD-10-CM | POA: Diagnosis not present

## 2019-09-11 LAB — CYTOLOGY - PAP: Diagnosis: NEGATIVE

## 2020-04-15 NOTE — Telephone Encounter (Signed)
Opened in error

## 2024-07-28 ENCOUNTER — Other Ambulatory Visit: Payer: Self-pay

## 2024-07-28 ENCOUNTER — Encounter: Payer: Self-pay | Admitting: Nurse Practitioner

## 2024-07-28 ENCOUNTER — Ambulatory Visit (INDEPENDENT_AMBULATORY_CARE_PROVIDER_SITE_OTHER): Admitting: Nurse Practitioner

## 2024-07-28 VITALS — BP 124/78 | HR 100 | Temp 98.2°F | Resp 16 | Ht 62.0 in | Wt 161.9 lb

## 2024-07-28 DIAGNOSIS — Z114 Encounter for screening for human immunodeficiency virus [HIV]: Secondary | ICD-10-CM

## 2024-07-28 DIAGNOSIS — E039 Hypothyroidism, unspecified: Secondary | ICD-10-CM | POA: Diagnosis not present

## 2024-07-28 DIAGNOSIS — M545 Low back pain, unspecified: Secondary | ICD-10-CM

## 2024-07-28 DIAGNOSIS — M25561 Pain in right knee: Secondary | ICD-10-CM

## 2024-07-28 DIAGNOSIS — M5126 Other intervertebral disc displacement, lumbar region: Secondary | ICD-10-CM | POA: Diagnosis not present

## 2024-07-28 DIAGNOSIS — M5416 Radiculopathy, lumbar region: Secondary | ICD-10-CM | POA: Diagnosis not present

## 2024-07-28 DIAGNOSIS — L989 Disorder of the skin and subcutaneous tissue, unspecified: Secondary | ICD-10-CM

## 2024-07-28 DIAGNOSIS — K760 Fatty (change of) liver, not elsewhere classified: Secondary | ICD-10-CM | POA: Insufficient documentation

## 2024-07-28 DIAGNOSIS — Z130 Encounter for screening for diseases of the blood and blood-forming organs and certain disorders involving the immune mechanism: Secondary | ICD-10-CM

## 2024-07-28 DIAGNOSIS — Z1231 Encounter for screening mammogram for malignant neoplasm of breast: Secondary | ICD-10-CM

## 2024-07-28 DIAGNOSIS — Z13220 Encounter for screening for lipoid disorders: Secondary | ICD-10-CM

## 2024-07-28 DIAGNOSIS — Z1159 Encounter for screening for other viral diseases: Secondary | ICD-10-CM

## 2024-07-28 DIAGNOSIS — Z131 Encounter for screening for diabetes mellitus: Secondary | ICD-10-CM

## 2024-07-28 DIAGNOSIS — M62838 Other muscle spasm: Secondary | ICD-10-CM | POA: Insufficient documentation

## 2024-07-28 DIAGNOSIS — G8929 Other chronic pain: Secondary | ICD-10-CM | POA: Insufficient documentation

## 2024-07-28 DIAGNOSIS — L301 Dyshidrosis [pompholyx]: Secondary | ICD-10-CM | POA: Insufficient documentation

## 2024-07-28 DIAGNOSIS — E663 Overweight: Secondary | ICD-10-CM | POA: Insufficient documentation

## 2024-07-28 MED ORDER — TRIAMCINOLONE ACETONIDE 0.1 % EX OINT: 1.0000 | TOPICAL_OINTMENT | Freq: Two times a day (BID) | CUTANEOUS | 6 refills | Status: AC

## 2024-07-28 MED ORDER — LEVOTHYROXINE SODIUM 75 MCG PO TABS: 75.0000 ug | ORAL_TABLET | Freq: Every day | ORAL | 1 refills | Status: DC

## 2024-07-28 MED ORDER — DIAZEPAM 5 MG PO TABS: 5.0000 mg | ORAL_TABLET | Freq: Four times a day (QID) | ORAL | 1 refills | Status: AC | PRN

## 2024-07-28 NOTE — Progress Notes (Addendum)
 BP 124/78 (Cuff Size: Large)   Pulse 100   Temp 98.2 F (36.8 C) (Oral)   Resp 16   Ht 5' 2 (1.575 m)   Wt 161 lb 14.4 oz (73.4 kg)   LMP  (LMP Unknown)   SpO2 96%   BMI 29.61 kg/m    Subjective:    Patient ID: Latoya Brady, female    DOB: 13-Nov-1971, 53 y.o.   MRN: [id number]  HPI: Latoya Brady is a 53 y.o. female  Chief Complaint  Patient presents with   Establish Care    Discussed the use of AI scribe software for clinical note transcription with the patient, who gave verbal consent to proceed.  History of Present Illness Latoya Brady is a 53 year old female with hypothyroidism, chronic back pain, and lumbar radiculopathy who presents to establish care.  Chronic lumbar radiculopathy and back pain - Chronic back pain and lumbar radiculopathy since a ruptured disc and surgeries in 2016 - Initial disc shaving surgery was unsuccessful, followed by a second surgery with metal insertion - Postoperative neuropathy-like symptoms including numbness from lower back to right leg and immobility in three toes on the right foot - Persistent sciatic nerve pain, worsened by sitting or standing, relieved by lying down - Difficulty with walking due to pain - Occasional use of Valium  for severe muscle cramps, especially after extensive walking  Hypothyroidism - Managed with levothyroxine  75 micrograms daily - Concern regarding regulation of thyroid  hormone levels, specifically T2 and T3  Pruritic dermatitis - Intermittent intensely pruritic bumps, primarily on hands - History of similar pruritic bumps on feet, previously relieved with topical cream  Skin lesion -has been there for 2 months or so on left forearm    Hypertension - History of elevated blood pressure during a test - Temporary prescription of losartan  25 milligrams by a cardiologist for a specific test -no longer taking  Hepatic steatosis and weight gain - Diagnosed with fatty liver following a  lung scan - Attributes fatty liver to weight gain after back surgery - Significant weight increase since age 53 - Difficulty losing weight due to limited physical activity from back pain  Allergic reactions - History of periorbital swelling of one eye upon waking, resolving by end of day - Severe allergic reaction to urinary tract infection medication with hives and intense pruritus, relieved by allergy medication     Waist Measurement : 40 inches  Body mass index is 29.61 kg/m.  Filed Weights   07/28/24 0759  Weight: 161 lb 14.4 oz (73.4 kg)       07/28/2024    7:59 AM  Depression screen PHQ 2/9  Decreased Interest 0  Down, Depressed, Hopeless 0  PHQ - 2 Score 0    Relevant past medical, surgical, family and social history reviewed and updated as indicated. Interim medical history since our last visit reviewed. Allergies and medications reviewed and updated.  Review of Systems  Constitutional: Negative for fever or weight change.  Respiratory: Negative for cough and shortness of breath.   Cardiovascular: Negative for chest pain or palpitations.  Gastrointestinal: Negative for abdominal pain, no bowel changes.  Musculoskeletal: Negative for gait problem or joint swelling. Chronic back pain Skin: Negative for rash.  Neurological: Negative for dizziness or headache.  No other specific complaints in a complete review of systems (except as listed in HPI above).      Objective:     BP 124/78 (Cuff Size: Large)   Pulse  100   Temp 98.2 F (36.8 C) (Oral)   Resp 16   Ht 5' 2 (1.575 m)   Wt 161 lb 14.4 oz (73.4 kg)   LMP  (LMP Unknown)   SpO2 96%   BMI 29.61 kg/m    Wt Readings from Last 3 Encounters:  07/28/24 161 lb 14.4 oz (73.4 kg)  05/31/17 166 lb 12.8 oz (75.7 kg)  12/10/15 143 lb (64.9 kg)    Physical Exam Physical Exam GENERAL: Alert, cooperative, well developed, no acute distress HEENT: Normocephalic, normal oropharynx, moist mucous membranes CHEST:  Clear to auscultation bilaterally, no wheezes, rhonchi, or crackles CARDIOVASCULAR: Normal heart rate and rhythm, S1 and S2 normal without murmurs ABDOMEN: Soft, non-tender, non-distended, without organomegaly, normal bowel sounds EXTREMITIES: No cyanosis or edema NEUROLOGICAL: Cranial nerves grossly intact, moves all extremities without gross motor or sensory deficit   Results for orders placed or performed in visit on 09/09/19  Cytology - PAP   Collection Time: 09/09/19 12:00 AM  Result Value Ref Range   Adequacy      Satisfactory for evaluation; transformation zone component PRESENT.   Diagnosis      - Negative for intraepithelial lesion or malignancy (NILM)          Assessment & Plan:   Problem List Items Addressed This Visit       Endocrine   Hypothyroidism - Primary   Relevant Medications   levothyroxine  (SYNTHROID ) 75 MCG tablet   Other Relevant Orders   Thyroid  Panel With TSH     Nervous and Auditory   Lumbar radiculopathy   Relevant Medications   diazepam  (VALIUM ) 5 MG tablet     Musculoskeletal and Integument   Recurrent herniation of lumbar disc   Dyshidrotic eczema   Relevant Medications   triamcinolone  ointment (KENALOG ) 0.1 %     Other   Chronic back pain   Relevant Medications   diazepam  (VALIUM ) 5 MG tablet   Muscle spasms of lower extremity   Relevant Medications   diazepam  (VALIUM ) 5 MG tablet   Chronic pain of right knee   Relevant Medications   diazepam  (VALIUM ) 5 MG tablet   Other Visit Diagnoses       Screening for deficiency anemia       Relevant Orders   CBC with Differential/Platelet     Screening for cholesterol level       Relevant Orders   Lipid panel     Screening for diabetes mellitus       Relevant Orders   Comprehensive metabolic panel with GFR   Hemoglobin A1c     Encounter for hepatitis C screening test for low risk patient       Relevant Orders   Hepatitis C antibody     Screening for HIV without presence of risk  factors       Relevant Orders   HIV Antibody (routine testing w rflx)     Encounter for screening mammogram for malignant neoplasm of breast       Relevant Orders   MM Digital Screening     Skin lesion of left arm       Relevant Orders   Ambulatory referral to Dermatology        Assessment and Plan Assessment & Plan Hypothyroidism Managed on levothyroxine  75 mcg daily. Inquired about T2 and T3 hormone levels and her maintenance within the normal range. Confirmed that all thyroid  hormone levels can be checked. - Continue levothyroxine  75 mcg daily. - Monitor  thyroid  function tests regularly.  Recurrent unilateral periorbital swelling, likely allergic Recurrent episodes likely due to an allergic reaction, resolving spontaneously as the day progresses. - Take over-the-counter antihistamines as needed for swelling.  Chronic low back pain/neuropathy/chronic right knee pain/muscle spasms -continue to take valium  as needed  Skin lesion -referral placed to dermatology  Hepatic steatosis -check labs -continue to work on lifestyle modification      Follow up plan: Return in about 6 months (around 01/28/2025) for follow up.

## 2024-07-28 NOTE — Patient Instructions (Signed)
 Healthy Weight Loss Guide ?? Weight Loss Goal - Aim for 1-2 pounds per week - Target: 5-10% of your starting body weight over 3-6 months ??? Nutrition Tips - Eat 3 meals per day and avoid skipping meals - Fill half your plate with vegetables, a quarter with protein, a quarter with whole grains - Choose lean proteins: chicken, fish, eggs, tofu, beans - Limit: - Sugary drinks (soda, sweet tea, juice) - Fried foods and fast food - Processed snacks (chips, candy, cookies) - Drink at least 64 oz of water per day - Practice portion control and mindful eating ???? Lifestyle Habits - Track what you eat (apps like MyFitnessPal, Lose It!, or a paper log) - Get 7-9 hours of sleep per night - Manage stress (meditation, breathing exercises, counseling if needed) - Limit alcohol (empty calories and may increase hunger) ???? Exercise Recommendations - Goal: 150 minutes per week of moderate activity (e.g., brisk walking, cycling) - Start with 10-15 minutes/day and build up gradually - Add 2 days per week of strength training (light weights, resistance bands, or bodyweight) ?? Remember: Progress > Perfection Small changes every day add up. Don't give up! - Avoid high-fat or greasy foods to reduce nausea - Focus on protein at each meal to preserve muscle mass - Stay well hydrated (at least 64 oz water per day) - Limit sugar and processed carbohydrates ?? Managing Side Effects if on weight loss medication - Eat slowly and stop eating when you feel full - Use anti-nausea strategies: ginger tea, peppermint, crackers - Talk to your provider about adjusting the dose if needed - Stool softeners or fiber supplements can help with constipation ?? Staying on Track - Track weight and non-scale victories (energy, clothing fit, labs) - Follow up with your provider regularly - Don't stop medication without medical guidance - Combine medication with healthy habits for best results ?? Remember Weight loss  medications are a tool, not a shortcut. Healthy habits matter. Be patient and consistent--small changes lead to big results.

## 2024-07-29 ENCOUNTER — Ambulatory Visit: Payer: Self-pay | Admitting: Nurse Practitioner

## 2024-07-29 ENCOUNTER — Other Ambulatory Visit: Payer: Self-pay | Admitting: Nurse Practitioner

## 2024-07-29 DIAGNOSIS — Z1231 Encounter for screening mammogram for malignant neoplasm of breast: Secondary | ICD-10-CM

## 2024-07-29 LAB — CBC WITH DIFFERENTIAL/PLATELET
Absolute Lymphocytes: 3613 {cells}/uL (ref 850–3900)
Absolute Monocytes: 491 {cells}/uL (ref 200–950)
Basophils Absolute: 164 {cells}/uL (ref 0–200)
Basophils Relative: 1.8 %
Eosinophils Absolute: 337 {cells}/uL (ref 15–500)
Eosinophils Relative: 3.7 %
HCT: 47.4 % — ABNORMAL HIGH (ref 35.0–45.0)
Hemoglobin: 16.1 g/dL — ABNORMAL HIGH (ref 11.7–15.5)
MCH: 31.8 pg (ref 27.0–33.0)
MCHC: 34 g/dL (ref 32.0–36.0)
MCV: 93.5 fL (ref 80.0–100.0)
MPV: 12.4 fL (ref 7.5–12.5)
Monocytes Relative: 5.4 %
Neutro Abs: 4495 {cells}/uL (ref 1500–7800)
Neutrophils Relative %: 49.4 %
Platelets: 253 Thousand/uL (ref 140–400)
RBC: 5.07 Million/uL (ref 3.80–5.10)
RDW: 12.6 % (ref 11.0–15.0)
Total Lymphocyte: 39.7 %
WBC: 9.1 Thousand/uL (ref 3.8–10.8)

## 2024-07-29 LAB — COMPREHENSIVE METABOLIC PANEL WITH GFR
AG Ratio: 2.2 (calc) (ref 1.0–2.5)
ALT: 24 U/L (ref 6–29)
AST: 18 U/L (ref 10–35)
Albumin: 5.3 g/dL — ABNORMAL HIGH (ref 3.6–5.1)
Alkaline phosphatase (APISO): 89 U/L (ref 37–153)
BUN: 12 mg/dL (ref 7–25)
CO2: 24 mmol/L (ref 20–32)
Calcium: 10.1 mg/dL (ref 8.6–10.4)
Chloride: 106 mmol/L (ref 98–110)
Creat: 0.7 mg/dL (ref 0.50–1.03)
Globulin: 2.4 g/dL (ref 1.9–3.7)
Glucose, Bld: 175 mg/dL — ABNORMAL HIGH (ref 65–99)
Potassium: 4.4 mmol/L (ref 3.5–5.3)
Sodium: 141 mmol/L (ref 135–146)
Total Bilirubin: 0.7 mg/dL (ref 0.2–1.2)
Total Protein: 7.7 g/dL (ref 6.1–8.1)
eGFR: 102 mL/min/1.73m2 (ref >=60)

## 2024-07-29 LAB — HIV ANTIBODY (ROUTINE TESTING W REFLEX): HIV 1&2 Ab, 4th Generation: NONREACTIVE

## 2024-07-29 LAB — THYROID PANEL WITH TSH
Free Thyroxine Index: 2.7 (ref 1.4–3.8)
T4, Total: 11.3 ug/dL (ref 5.1–11.9)
TSH: 24 m[IU]/L — AB (ref 22–35)
TSH: 7.11 m[IU]/L — ABNORMAL HIGH

## 2024-07-29 LAB — HEMOGLOBIN A1C
Hgb A1c MFr Bld: 7.4 % — ABNORMAL HIGH (ref <5.7)
Mean Plasma Glucose: 166 mg/dL
eAG (mmol/L): 9.2 mmol/L

## 2024-07-29 LAB — LIPID PANEL
Cholesterol: 213 mg/dL — ABNORMAL HIGH (ref <200)
HDL: 41 mg/dL — ABNORMAL LOW (ref >=50)
LDL Cholesterol (Calc): 128 mg/dL — ABNORMAL HIGH
Non-HDL Cholesterol (Calc): 172 mg/dL — ABNORMAL HIGH (ref <130)
Total CHOL/HDL Ratio: 5.2 (calc) — ABNORMAL HIGH (ref <5.0)
Triglycerides: 287 mg/dL — ABNORMAL HIGH (ref <150)

## 2024-07-29 LAB — HEPATITIS C ANTIBODY: Hepatitis C Ab: NONREACTIVE

## 2024-07-30 ENCOUNTER — Ambulatory Visit (INDEPENDENT_AMBULATORY_CARE_PROVIDER_SITE_OTHER): Admitting: Nurse Practitioner

## 2024-07-30 ENCOUNTER — Telehealth: Payer: Self-pay

## 2024-07-30 ENCOUNTER — Other Ambulatory Visit (HOSPITAL_COMMUNITY): Payer: Self-pay

## 2024-07-30 ENCOUNTER — Telehealth: Payer: Self-pay | Admitting: Pharmacy Technician

## 2024-07-30 ENCOUNTER — Encounter: Payer: Self-pay | Admitting: Nurse Practitioner

## 2024-07-30 VITALS — BP 136/86 | HR 96 | Temp 98.0°F | Resp 18 | Ht 62.0 in | Wt 161.9 lb

## 2024-07-30 DIAGNOSIS — Z7984 Long term (current) use of oral hypoglycemic drugs: Secondary | ICD-10-CM

## 2024-07-30 DIAGNOSIS — E1165 Type 2 diabetes mellitus with hyperglycemia: Secondary | ICD-10-CM

## 2024-07-30 DIAGNOSIS — E039 Hypothyroidism, unspecified: Secondary | ICD-10-CM

## 2024-07-30 MED ORDER — BLOOD GLUCOSE TEST VI STRP: 1.0000 | ORAL_STRIP | Freq: Three times a day (TID) | 0 refills | Status: AC

## 2024-07-30 MED ORDER — LANCET DEVICE MISC: 1.0000 | Freq: Three times a day (TID) | 0 refills | Status: AC

## 2024-07-30 MED ORDER — OZEMPIC (0.25 OR 0.5 MG/DOSE) 2 MG/3ML ~~LOC~~ SOPN: 0.2500 mg | PEN_INJECTOR | SUBCUTANEOUS | 2 refills | Status: DC

## 2024-07-30 MED ORDER — LEVOTHYROXINE SODIUM 88 MCG PO TABS: 88.0000 ug | ORAL_TABLET | Freq: Every day | ORAL | 0 refills | Status: DC

## 2024-07-30 MED ORDER — BLOOD GLUCOSE MONITORING SUPPL DEVI: 1.0000 | Freq: Three times a day (TID) | 0 refills | Status: AC

## 2024-07-30 MED ORDER — LANCETS MISC. MISC: 1.0000 | Freq: Three times a day (TID) | 0 refills | Status: AC

## 2024-07-30 NOTE — Progress Notes (Signed)
 BP 136/86   Pulse 96   Temp 98 F (36.7 C)   Resp 18   Ht 5' 2 (1.575 m)   Wt 161 lb 14.4 oz (73.4 kg)   LMP  (LMP Unknown)   SpO2 96%   BMI 29.61 kg/m    Subjective:    Patient ID: Latoya Brady, female    DOB: 07-19-1971, 53 y.o.   MRN: [id number]  HPI: Latoya Brady is a 53 y.o. female  Chief Complaint  Patient presents with   Diabetes    New onset    Discussed the use of AI scribe software for clinical note transcription with the patient, who gave verbal consent to proceed.  History of Present Illness Latoya Brady is a 53 year old female who presents for discussion of treatment options for newly diagnosed diabetes.   Hyperglycemia and diabetes management - Newly diagnosed with diabetes mellitus - Hemoglobin A1c measured at 7.0% - Concerned about initiating metformin due to her husband's negative experiences with the medication - Interested in alternative therapies, specifically GLP-1 receptor agonists - Willing to monitor blood glucose levels two to three times per week in the morning before eating  Peripheral neuropathy symptoms - Sensation of numbness in both feet - Intact ability to perceive touch  Weight management and hydration - Chronic weight management difficulties - Low water  intake, typically taking three to four days to finish a bottle of water  - Consumes water  primarily during meals, as she feels it affects the taste of her food  Thyroid  hormone replacement therapy - Longstanding use of levothyroxine  75 mcg daily for thyroid  disorder - Takes medication every morning         07/28/2024    7:59 AM  Depression screen PHQ 2/9  Decreased Interest 0  Down, Depressed, Hopeless 0  PHQ - 2 Score 0    Relevant past medical, surgical, family and social history reviewed and updated as indicated. Interim medical history since our last visit reviewed. Allergies and medications reviewed and updated.  Review of Systems  Constitutional:  Negative for fever or weight change.  Respiratory: Negative for cough and shortness of breath.   Cardiovascular: Negative for chest pain or palpitations.  Gastrointestinal: Negative for abdominal pain, no bowel changes.  Musculoskeletal: Negative for gait problem or joint swelling.  Skin: Negative for rash.  Neurological: Negative for dizziness or headache.  No other specific complaints in a complete review of systems (except as listed in HPI above).      Objective:     BP 136/86   Pulse 96   Temp 98 F (36.7 C)   Resp 18   Ht 5' 2 (1.575 m)   Wt 161 lb 14.4 oz (73.4 kg)   LMP  (LMP Unknown)   SpO2 96%   BMI 29.61 kg/m    Wt Readings from Last 3 Encounters:  07/30/24 161 lb 14.4 oz (73.4 kg)  07/28/24 161 lb 14.4 oz (73.4 kg)  05/31/17 166 lb 12.8 oz (75.7 kg)    Physical Exam Physical Exam GENERAL: Alert, cooperative, well developed, no acute distress. HEENT: Normocephalic, normal oropharynx, moist mucous membranes. CHEST: Clear to auscultation bilaterally, no wheezes, rhonchi, or crackles. CARDIOVASCULAR: Normal heart rate and rhythm, S1 and S2 normal without murmurs. ABDOMEN: Soft, non-tender, non-distended, without organomegaly, normal bowel sounds. EXTREMITIES: No cyanosis or edema. NEUROLOGICAL: Cranial nerves grossly intact, moves all extremities without gross motor or sensory deficit, sensation intact on plantar surface of feet.   Diabetic  Foot Exam - Simple   Simple Foot Form Diabetic Foot exam was performed with the following findings: Yes 07/30/2024  8:58 AM  Visual Inspection No deformities, no ulcerations, no other skin breakdown bilaterally: Yes Sensation Testing Intact to touch and monofilament testing bilaterally: Yes Pulse Check Posterior Tibialis and Dorsalis pulse intact bilaterally: Yes Comments     Results for orders placed or performed in visit on 07/28/24  CBC with Differential/Platelet   Collection Time: 07/28/24 12:00 AM  Result Value  Ref Range   WBC 9.1 3.8 - 10.8 Thousand/uL   RBC 5.07 3.80 - 5.10 Million/uL   Hemoglobin 16.1 (H) 11.7 - 15.5 g/dL   HCT 52.5 (H) 64.9 - 54.9 %   MCV 93.5 80.0 - 100.0 fL   MCH 31.8 27.0 - 33.0 pg   MCHC 34.0 32.0 - 36.0 g/dL   RDW 87.3 88.9 - 84.9 %   Platelets 253 140 - 400 Thousand/uL   MPV 12.4 7.5 - 12.5 fL   Neutro Abs 4,495 1,500 - 7,800 cells/uL   Absolute Lymphocytes 3,613 850 - 3,900 cells/uL   Absolute Monocytes 491 200 - 950 cells/uL   Eosinophils Absolute 337 15 - 500 cells/uL   Basophils Absolute 164 0 - 200 cells/uL   Neutrophils Relative % 49.4 %   Total Lymphocyte 39.7 %   Monocytes Relative 5.4 %   Eosinophils Relative 3.7 %   Basophils Relative 1.8 %  Comprehensive metabolic panel with GFR   Collection Time: 07/28/24 12:00 AM  Result Value Ref Range   Glucose, Bld 175 (H) 65 - 99 mg/dL   BUN 12 7 - 25 mg/dL   Creat 9.29 9.49 - 8.96 mg/dL   eGFR 897 > OR = 60 fO/fpw/8.26f7   BUN/Creatinine Ratio SEE NOTE: 6 - 22 (calc)   Sodium 141 135 - 146 mmol/L   Potassium 4.4 3.5 - 5.3 mmol/L   Chloride 106 98 - 110 mmol/L   CO2 24 20 - 32 mmol/L   Calcium 10.1 8.6 - 10.4 mg/dL   Total Protein 7.7 6.1 - 8.1 g/dL   Albumin 5.3 (H) 3.6 - 5.1 g/dL   Globulin 2.4 1.9 - 3.7 g/dL (calc)   AG Ratio 2.2 1.0 - 2.5 (calc)   Total Bilirubin 0.7 0.2 - 1.2 mg/dL   Alkaline phosphatase (APISO) 89 37 - 153 U/L   AST 18 10 - 35 U/L   ALT 24 6 - 29 U/L  Lipid panel   Collection Time: 07/28/24 12:00 AM  Result Value Ref Range   Cholesterol 213 (H) <200 mg/dL   HDL 41 (L) > OR = 50 mg/dL   Triglycerides 712 (H) <150 mg/dL   LDL Cholesterol (Calc) 128 (H) mg/dL (calc)   Total CHOL/HDL Ratio 5.2 (H) <5.0 (calc)   Non-HDL Cholesterol (Calc) 172 (H) <130 mg/dL (calc)  Hemoglobin J8r   Collection Time: 07/28/24 12:00 AM  Result Value Ref Range   Hgb A1c MFr Bld 7.4 (H) <5.7 %   Mean Plasma Glucose 166 mg/dL   eAG (mmol/L) 9.2 mmol/L  Hepatitis C antibody   Collection Time:  07/28/24 12:00 AM  Result Value Ref Range   Hepatitis C Ab NON-REACTIVE NON-REACTIVE  HIV Antibody (routine testing w rflx)   Collection Time: 07/28/24 12:00 AM  Result Value Ref Range   HIV FINAL INTERPRETATION     HIV 1&2 Ab, 4th Generation NON-REACTIVE NON-REACTIVE  Thyroid  Panel With TSH   Collection Time: 07/28/24 12:00 AM  Result Value Ref  Range   T3 Uptake 24 22 - 35 %   T4, Total 11.3 5.1 - 11.9 mcg/dL   Free Thyroxine Index 2.7 1.4 - 3.8   TSH 7.11 (H) mIU/L          Assessment & Plan:   Problem List Items Addressed This Visit       Endocrine   Hypothyroidism   Relevant Medications   levothyroxine  (SYNTHROID ) 88 MCG tablet   Type 2 diabetes mellitus with hyperglycemia, without long-term current use of insulin (HCC) - Primary   Relevant Medications   Semaglutide ,0.25 or 0.5MG /DOS, (OZEMPIC , 0.25 OR 0.5 MG/DOSE,) 2 MG/3ML SOPN   Blood Glucose Monitoring Suppl DEVI   Glucose Blood (BLOOD GLUCOSE TEST STRIPS) STRP   Lancet Device MISC   Lancets Misc. MISC   Other Relevant Orders   HM Diabetes Foot Exam (Completed)     Assessment and Plan Assessment & Plan Type 2 diabetes mellitus New diagnosis with an A1c of 7, indicating uncontrolled diabetes. Discussed GLP-1 medications (Mounjaro  and Ozempic ) as alternatives to metformin due to her preference and family history of adverse reactions to metformin. Discussed potential side effects of GLP-1 medications, including decreased appetite, nausea, diarrhea, constipation, and fatigue. Explained the mechanism of action of GLP-1 medications and the importance of injection site rotation. Discussed the benefits of Ozempic  for heart and kidney health. - Prescribe Ozempic  starting at 0.25 mg weekly for the first month. - Instruct to check blood sugar twice or three times a week in the morning before eating. - Order microalbumin urine test to assess kidney function. - Advise to get a diabetic eye exam. - Perform foot exam to  assess sensation. - Encourage increased water  intake and fiber to manage potential constipation. - Schedule follow-up in three months to recheck A1c.  Hypothyroidism Long-standing hypothyroidism with current treatment of levothyroxine  75 mcg. Recent TSH levels indicate the need for an increased dose. - Increase levothyroxine  dose to 88 mcg daily. - Instruct to retain the 75 mcg tablets in case of future dose adjustments. - Schedule follow-up in three months to recheck thyroid  levels.        Follow up plan: Return in about 3 months (around 10/30/2024) for follow up.

## 2024-07-30 NOTE — Telephone Encounter (Signed)
 Copied from CRM #[id number]. Topic: Clinical - Medication Prior Auth >> Jul 30, 2024 12:43 PM Sophia H wrote: Reason for CRM: Patient states the pharmacy advised the Semaglutide ,0.25 or 0.5MG /DOS, (OZEMPIC , 0.25 OR 0.5 MG/DOSE,) 2 MG/3ML SOPN will need a prior authorization. Please submit to insurance, cost at pharmacy is $400+. Pt phone number # [phone] or can be reached via my chart.

## 2024-07-30 NOTE — Telephone Encounter (Signed)
 PA request has been Received. New Encounter has been or will be created for follow up. For additional info see Pharmacy Prior Auth telephone encounter from 07/30/2024.

## 2024-07-30 NOTE — Telephone Encounter (Signed)
Please proceed with PA. Thanks

## 2024-07-30 NOTE — Telephone Encounter (Signed)
 Pharmacy Patient Advocate Encounter   Received notification from Pt Calls Messages that prior authorization for Ozempic  0.25 mg is required/requested.   Insurance verification completed.   The patient is insured through [employer] .   Per test claim: Refill too soon. PA is not needed at this time. Medication was filled 07/30/2024. Next eligible fill date is 08/20/2024.   I did call and speak to Abby @ [facility] and she stated her insurance was used and her copay came back as $429.02 and their cash price without insurance would have been $1177.00.  The patient can call her insurance company and ask that they spread that copay out each month if she needs to.

## 2024-07-31 ENCOUNTER — Other Ambulatory Visit: Payer: Self-pay | Admitting: Nurse Practitioner

## 2024-07-31 DIAGNOSIS — E1165 Type 2 diabetes mellitus with hyperglycemia: Secondary | ICD-10-CM

## 2024-07-31 MED ORDER — TIRZEPATIDE 2.5 MG/0.5ML ~~LOC~~ SOAJ: 2.5000 mg | SUBCUTANEOUS | 3 refills | Status: DC

## 2024-08-06 LAB — HM DIABETES EYE EXAM

## 2024-08-27 ENCOUNTER — Other Ambulatory Visit: Payer: Self-pay | Admitting: Nurse Practitioner

## 2024-08-27 DIAGNOSIS — E1165 Type 2 diabetes mellitus with hyperglycemia: Secondary | ICD-10-CM

## 2024-08-28 NOTE — Telephone Encounter (Signed)
 Requested medication (s) are due for refill today: yes  Requested medication (s) are on the active medication list: yes  Last refill:  07/30/24  Future visit scheduled: yes  Notes to clinic:  ordered expires 08/29/24    Requested Prescriptions  Pending Prescriptions Disp Refills   Accu-Chek Softclix Lancets lancets [Pharmacy Med Name: ACCU-CHEK SOFTCLIX LANCETS] 100 each     Sig: CHECK GLUCOSE IN MORNING,NOON,AND AT BEDTIME     Endocrinology: Diabetes - Testing Supplies Passed - 08/28/2024  2:51 PM      Passed - Valid encounter within last 12 months    Recent Outpatient Visits           4 weeks ago Type 2 diabetes mellitus with hyperglycemia, without long-term current use of insulin [facility])   [facility] Gareth Mliss FALCON, FNP   1 month ago Hypothyroidism, unspecified type   [facility] Gareth Mliss FALCON, FNP       Future Appointments             In 2 weeks Paci, Karina M, MD [facility]   In 2 months Gareth, Mliss FALCON, FNP [facility] [place]

## 2024-09-01 ENCOUNTER — Telehealth: Payer: Self-pay | Admitting: Pharmacy Technician

## 2024-09-01 ENCOUNTER — Other Ambulatory Visit (HOSPITAL_COMMUNITY): Payer: Self-pay

## 2024-09-01 DIAGNOSIS — E1165 Type 2 diabetes mellitus with hyperglycemia: Secondary | ICD-10-CM

## 2024-09-01 MED ORDER — LANCETS MISC: 6 refills | Status: AC

## 2024-09-01 NOTE — Addendum Note (Signed)
 Addended by: YVONE WARREN BROCKS on: 09/01/2024 04:35 PM   Modules accepted: Orders

## 2024-09-01 NOTE — Telephone Encounter (Signed)
 Pharmacy Patient Advocate Encounter   Received notification from Onbase that prior authorization for Accu-Chek Softclix Lancets is required/requested.   Insurance verification completed.   The patient is insured through [facility] [employer] .   Per test claim:  [employer] is preferred by the insurance.  If suggested medication is appropriate, Please send in a new RX and discontinue this one. If not, please advise as to why it's not appropriate so that we may request a Prior Authorization. Please note, some preferred medications may still require a PA.  If the suggested medications have not been trialed and there are no contraindications to their use, the PA will not be submitted, as it will not be approved.

## 2024-09-11 ENCOUNTER — Other Ambulatory Visit: Payer: Self-pay | Admitting: *Deleted

## 2024-09-11 ENCOUNTER — Inpatient Hospital Stay
Admission: RE | Admit: 2024-09-11 | Discharge: 2024-09-11 | Disposition: A | Discharge status: Home / Self Care | Payer: Self-pay | Source: Ambulatory Visit | Attending: Nurse Practitioner | Admitting: Nurse Practitioner

## 2024-09-11 ENCOUNTER — Ambulatory Visit
Admission: RE | Admit: 2024-09-11 | Discharge: 2024-09-11 | Disposition: A | Discharge status: Home / Self Care | Source: Ambulatory Visit | Attending: Nurse Practitioner | Admitting: Nurse Practitioner

## 2024-09-11 DIAGNOSIS — Z1231 Encounter for screening mammogram for malignant neoplasm of breast: Secondary | ICD-10-CM

## 2024-09-15 DIAGNOSIS — Z1211 Encounter for screening for malignant neoplasm of colon: Secondary | ICD-10-CM | POA: Diagnosis not present

## 2024-09-15 DIAGNOSIS — Z1212 Encounter for screening for malignant neoplasm of rectum: Secondary | ICD-10-CM | POA: Diagnosis not present

## 2024-09-17 ENCOUNTER — Ambulatory Visit: Admitting: Dermatology

## 2024-09-17 ENCOUNTER — Encounter: Payer: Self-pay | Admitting: Dermatology

## 2024-09-17 VITALS — BP 139/89 | HR 109

## 2024-09-17 DIAGNOSIS — L570 Actinic keratosis: Secondary | ICD-10-CM

## 2024-09-17 DIAGNOSIS — L301 Dyshidrosis [pompholyx]: Secondary | ICD-10-CM

## 2024-09-17 DIAGNOSIS — W908XXA Exposure to other nonionizing radiation, initial encounter: Secondary | ICD-10-CM | POA: Diagnosis not present

## 2024-09-17 DIAGNOSIS — L905 Scar conditions and fibrosis of skin: Secondary | ICD-10-CM

## 2024-09-17 MED ORDER — CLOBETASOL PROPIONATE 0.05 % EX OINT: 1.0000 | TOPICAL_OINTMENT | Freq: Two times a day (BID) | CUTANEOUS | 3 refills | Status: AC

## 2024-09-17 NOTE — Progress Notes (Unsigned)
 New Patient Visit   Subjective  Latoya Brady is a 53 y.o. female who presents for the following: spots of concern.  History of substantial sun exposure. Lesion of concern on her left cheek, present for several months, scaly and tender.   Lesion on her left forearm and right lower leg, started as a blister but has since resolved. Does not recall any trauma or bug bites.   The following portions of the chart were reviewed this encounter and updated as appropriate: medications, allergies, medical history  Review of Systems:  No other skin or systemic complaints except as noted in HPI or Assessment and Plan.  Objective  Well appearing patient in no apparent distress; mood and affect are within normal limits.  A focused examination was performed of the following areas: Left arm and right lower leg  Relevant exam findings are noted in the Assessment and Plan.  Left Forearm - Anterior, Right Lower Leg - Anterior Patient previously had blisters but they are gone now and a scar is left in its place. Patient should call to be seen if they return or new ones pop up. Left Parotid Area Erythematous thin papules/macules with gritty scale.   Assessment & Plan   SCAR Exam: Dyspigmented smooth macule or patch- left forearm and right lower leg Benign-appearing.  Observation.  Call clinic for new or changing lesions. Recommend daily broad spectrum sunscreen SPF 30+, reapply every 2 hours as needed. Treatment: Scars remodel on their own for a full year and will gradually improve in appearance over time. Discussed that lesions are likely from previous outside in process with likely insect bite as initial culprit. OK to watch for now   Dyshidrotic Eczema- Hands Patient counseled regarding diagnosis of dyshidrotic eczema, a form of eczema characterized by recurrent outbreaks of small, deep-seated vesicles on the palms, sides of the fingers, and/or soles, often associated with intense itching,  burning, or stinging. This condition is thought to be multifactorial, with possible triggers including stress, heat/sweating, irritant exposure (e.g., soaps, detergents), allergens, and occasionally a history of atopy. Episodes may be acute, recurrent, or chronic with peeling and fissuring in later stages. Discussed importance of skin barrier protection, including regular use of thick emollients and avoidance of known irritants. Recommended use of topical corticosteroids, particularly during flares, and advised patient on appropriate potency and duration of use. For more severe or refractory cases, other options such as topical calcineurin inhibitors, phototherapy, or systemic therapy may be considered. Advised patient to monitor for signs of secondary infection (e.g., increased redness, pain, pus) and to return for follow-up if flares become frequent or severe. Patient verbalized understanding and agrees with plan. - Clobetasol ointment Rxed BID  ACTINIC KERATOSIS Exam: Erythematous thin papules/macules with gritty scale  Actinic keratoses are precancerous spots that appear secondary to cumulative UV radiation exposure/sun exposure over time. They are chronic with expected duration over 1 year. A portion of actinic keratoses will progress to squamous cell carcinoma of the skin. It is not possible to reliably predict which spots will progress to skin cancer and so treatment is recommended to prevent development of skin cancer.  Recommend daily broad spectrum sunscreen SPF 30+ to sun-exposed areas, reapply every 2 hours as needed.  Recommend staying in the shade or wearing long sleeves, sun glasses (UVA+UVB protection) and wide brim hats (4-inch brim around the entire circumference of the hat). Call for new or changing lesions.  Treatment Plan:  Prior to procedure, discussed risks of blister formation, small  wound, skin dyspigmentation, or rare scar following cryotherapy. Recommend Vaseline ointment to  treated areas while healing.  SCAR (2) Left Forearm - Anterior, Right Lower Leg - Anterior DYSHIDROTIC DERMATITIS Left Hand - Anterior, Right Hand - Anterior Related Medications clobetasol ointment (TEMOVATE) 0.05 % Apply 1 Application topically 2 (two) times daily. AK (ACTINIC KERATOSIS) Left Parotid Area Destruction of lesion - Left Parotid Area Complexity: simple   Destruction method: cryotherapy   Informed consent: discussed and consent obtained   Timeout:  patient name, date of birth, surgical site, and procedure verified Lesion destroyed using liquid nitrogen: Yes   Region frozen until ice ball extended beyond lesion: Yes   Cryotherapy cycles:  2 Outcome: patient tolerated procedure well with no complications   Post-procedure details: wound care instructions given     Return if symptoms worsen or fail to improve, for return if new spots pop up.  I, Berwyn Lesches, Surg Tech III, am acting as scribe for RUFUS CHRISTELLA HOLY, MD.   Documentation: I have reviewed the above documentation for accuracy and completeness, and I agree with the above.  RUFUS CHRISTELLA HOLY, MD

## 2024-09-17 NOTE — Patient Instructions (Addendum)
 For areas treated with Liquid Nitrogen:  Keep clean with soap and water.  Apply Vaseline or Aquaphor twice daily.  Important Information  Due to recent changes in healthcare laws, you may see results of your pathology and/or laboratory studies on MyChart before the doctors have had a chance to review them. We understand that in some cases there may be results that are confusing or concerning to you. Please understand that not all results are received at the same time and often the doctors may need to interpret multiple results in order to provide you with the best plan of care or course of treatment. Therefore, we ask that you please give [place] 2 business days to thoroughly review all your results before contacting the office for clarification. Should we see a critical lab result, you will be contacted sooner.   If You Need Anything After Your Visit  If you have any questions or concerns for your doctor, please call our main line at [phone] If no one answers, please leave a voicemail as directed and we will return your call as soon as possible. Messages left after 4 pm will be answered the following business day.   You may also send [place] a message via MyChart. We typically respond to MyChart messages within 1-2 business days.  For prescription refills, please ask your pharmacy to contact our office. Our fax number is [phone].  If you have an urgent issue when the clinic is closed that cannot wait until the next business day, you can page your doctor at the number below.    Please note that while we do our best to be available for urgent issues outside of office hours, we are not available 24/7.   If you have an urgent issue and are unable to reach [place], you may choose to seek medical care at your doctor's office, retail clinic, urgent care center, or emergency room.  If you have a medical emergency, please immediately call 911 or go to the emergency department. In the event of inclement  weather, please call our main line at [phone] for an update on the status of any delays or closures.  Dermatology Medication Tips: Please keep the boxes that topical medications come in in order to help keep track of the instructions about where and how to use these. Pharmacies typically print the medication instructions only on the boxes and not directly on the medication tubes.   If your medication is too expensive, please contact our office at [phone] or send [place] a message through MyChart.   We are unable to tell what your co-pay for medications will be in advance as this is different depending on your insurance coverage. However, we may be able to find a substitute medication at lower cost or fill out paperwork to get insurance to cover a needed medication.   If a prior authorization is required to get your medication covered by your insurance company, please allow [place] 1-2 business days to complete this process.  Drug prices often vary depending on where the prescription is filled and some pharmacies may offer cheaper prices.  The website www.goodrx.com contains coupons for medications through different pharmacies. The prices here do not account for what the cost may be with help from insurance (it may be cheaper with your insurance), but the website can give you the price if you did not use any insurance.  - You can print the associated coupon and take it with your prescription to the pharmacy.  -  You may also stop by our office during regular business hours and pick up a GoodRx coupon card.  - If you need your prescription sent electronically to a different pharmacy, notify our office through [facility] or by phone at [phone]    Skin Education :   I counseled the patient regarding the following: Sun screen (SPF 30 or greater) should be applied during peak UV exposure (between 10am and 2pm) and reapplied after exercise or swimming.  The ABCDEs of melanoma were  reviewed with the patient, and the importance of monthly self-examination of moles was emphasized. Should any moles change in shape or color, or itch, bleed or burn, pt will contact our office for evaluation sooner then their interval appointment.  Plan: Sunscreen Recommendations I recommended a broad spectrum sunscreen with a SPF of 30 or higher. I explained that SPF 30 sunscreens block approximately 97 percent of the sun's harmful rays. Sunscreens should be applied at least 15 minutes prior to expected sun exposure and then every 2 hours after that as long as sun exposure continues. If swimming or exercising sunscreen should be reapplied every 45 minutes to an hour after getting wet or sweating. One ounce, or the equivalent of a shot glass full of sunscreen, is adequate to protect the skin not covered by a bathing suit. I also recommended a lip balm with a sunscreen as well. Sun protective clothing can be used in lieu of sunscreen but must be worn the entire time you are exposed to the sun's rays.

## 2024-09-18 LAB — COLOGUARD: COLOGUARD: NEGATIVE

## 2024-09-25 ENCOUNTER — Ambulatory Visit (INDEPENDENT_AMBULATORY_CARE_PROVIDER_SITE_OTHER)

## 2024-09-25 DIAGNOSIS — Z0000 Encounter for general adult medical examination without abnormal findings: Secondary | ICD-10-CM

## 2024-09-25 NOTE — Patient Instructions (Addendum)
 [name],  Thank you for taking the time for your Medicare Wellness Visit. I appreciate your continued commitment to your health goals. Please review the care plan we discussed, and feel free to reach out if I can assist you further.  Medicare recommends these wellness visits once per year to help you and your care team stay ahead of potential health issues. These visits are designed to focus on prevention, allowing your provider to concentrate on managing your acute and chronic conditions during your regular appointments.  Please note that Annual Wellness Visits do not include a physical exam. Some assessments may be limited, especially if the visit was conducted virtually. If needed, we may recommend a separate in-person follow-up with your provider.  Ongoing Care Seeing your primary care provider every 3 to 6 months helps us  monitor your health and provide consistent, personalized care.   Referrals If a referral was made during today's visit and you haven't received any updates within two weeks, please contact the referred provider directly to check on the status.  Recommended Screenings:  Health Maintenance  Topic Date Due   COVID-19 Vaccine (1) Never done   Yearly kidney health urinalysis for diabetes  Never done   Colon Cancer Screening  Never done   Pap with HPV screening  09/08/2022   Zoster (Shingles) Vaccine (1 of 2) 10/28/2024*   Flu Shot  03/10/2025*   Pneumococcal Vaccine for age over 37 (1 of 2 - PCV) 07/28/2025*   Hemoglobin A1C  01/28/2025   Yearly kidney function blood test for diabetes  07/28/2025   Complete foot exam   07/30/2025   Eye exam for diabetics  08/06/2025   Breast Cancer Screening  09/11/2025   Medicare Annual Wellness Visit  09/25/2025   DTaP/Tdap/Td vaccine (2 - Td or Tdap) 09/08/2029   Hepatitis C Screening  Completed   HIV Screening  Completed   HPV Vaccine  Aged Out   Meningitis B Vaccine  Aged Out   Hepatitis B Vaccine  Discontinued  *Topic  was postponed. The date shown is not the original due date.     Advance Care Planning is important because it: Ensures you receive medical care that aligns with your values, goals, and preferences. Provides guidance to your family and loved ones, reducing the emotional burden of decision-making during critical moments.  Vision: Annual vision screenings are recommended for early detection of glaucoma, cataracts, and diabetic retinopathy. These exams can also reveal signs of chronic conditions such as diabetes and high blood pressure.  Dental: Annual dental screenings help detect early signs of oral cancer, gum disease, and other conditions linked to overall health, including heart disease and diabetes.  Please see the attached documents for additional preventive care recommendations.   NEXT AWV 10/01/25 @ 10:10 AM BY PHONE

## 2024-09-25 NOTE — Progress Notes (Signed)
 Subjective:   Latoya Brady is a 53 y.o. who presents for a Medicare Wellness preventive visit.  As a reminder, Annual Wellness Visits don't include a physical exam, and some assessments may be limited, especially if this visit is performed virtually. We may recommend an in-person follow-up visit with your provider if needed.  Visit Complete: Virtual I connected with  [name] on 09/25/24 by a audio enabled telemedicine application and verified that I am speaking with the correct person using two identifiers.  Patient Location: Home  Provider Location: Home Office  I discussed the limitations of evaluation and management by telemedicine. The patient expressed understanding and agreed to proceed.  Vital Signs: Because this visit was a virtual/telehealth visit, some criteria may be missing or patient reported. Any vitals not documented were not able to be obtained and vitals that have been documented are patient reported.  VideoError- Librarian, academic were attempted between this provider and patient, however failed, due to patient having technical difficulties OR patient did not have access to video capability.  We continued and completed visit with audio only.   Persons Participating in Visit: Patient.  AWV Questionnaire: No: Patient Medicare AWV questionnaire was not completed prior to this visit.  Cardiac Risk Factors include: diabetes mellitus;sedentary lifestyle;smoking/ tobacco exposure     Objective:    There were no vitals filed for this visit. There is no height or weight on file to calculate BMI.     09/25/2024   10:57 AM 12/25/2015   12:50 PM 12/10/2015    1:01 PM 11/20/2015   12:39 AM 11/16/2015    8:30 PM 11/11/2015    8:16 AM  Advanced Directives  Does Patient Have a Medical Advance Directive? No No  No  No  No  No   Would patient like information on creating a medical advance directive? No - Patient declined  No - patient  declined information  No - patient declined information  No - patient declined information  No - patient declined information      Data saved with a previous flowsheet row definition    Current Medications (verified) Outpatient Encounter Medications as of 09/25/2024  Medication Sig   Blood Glucose Monitoring Suppl DEVI 1 each by Does not apply route in the morning, at noon, and at bedtime. May substitute to any manufacturer covered by patient's insurance.   clobetasol ointment (TEMOVATE) 0.05 % Apply 1 Application topically 2 (two) times daily.   diazepam  (VALIUM ) 5 MG tablet Take 1 tablet (5 mg total) by mouth every 6 (six) hours as needed for muscle spasms.   Lancets MISC Check BG bid, DX E11.65   levothyroxine  (SYNTHROID ) 88 MCG tablet Take 1 tablet (88 mcg total) by mouth daily.   triamcinolone  ointment (KENALOG ) 0.1 % Apply 1 Application topically 2 (two) times daily. To affected areas   tirzepatide  (MOUNJARO ) 2.5 MG/0.5ML Pen Inject 2.5 mg into the skin once a week.   No facility-administered encounter medications on file as of 09/25/2024.    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   Recurrent low back pain 12/10/2015   Past Surgical History:  Procedure Laterality Date   ABDOMINAL HYSTERECTOMY     BACK SURGERY  CARPAL TUNNEL RELEASE Bilateral    LUMBAR WOUND DEBRIDEMENT N/A 12/11/2015   Procedure: EXPLORATION OF LUMBAR SEROMA;  Surgeon: Catalina Stains, MD;  Location: [facility];  Service: Neurosurgery;  Laterality: N/A;   SPINE SURGERY  2016   TUBAL LIGATION  2000   Family History  Problem Relation Age of Onset   Breast cancer Maternal Aunt    Social History   Socioeconomic History   Marital status: Married     Spouse name: Not on file   Number of children: 2   Years of education: Not on file   Highest education level: 12th grade  Occupational History   Not on file  Tobacco Use   Smoking status: Every Day    Current packs/day: 0.75    Average packs/day: 0.8 packs/day for 25.0 years (18.8 ttl pk-yrs)    Types: Cigarettes   Smokeless tobacco: Never  Vaping Use   Vaping status: Never Used  Substance and Sexual Activity   Alcohol use: Yes   Drug use: No   Sexual activity: Not Currently  Other Topics Concern   Not on file  Social History Narrative   Not on file   Social Drivers of Health   Financial Resource Strain: Low Risk  (09/25/2024)   Overall Financial Resource Strain (CARDIA)    Difficulty of Paying Living Expenses: Not hard at all  Food Insecurity: No Food Insecurity (09/25/2024)   Hunger Vital Sign    Worried About Running Out of Food in the Last Year: Never true    Ran Out of Food in the Last Year: Never true  Transportation Needs: No Transportation Needs (09/25/2024)   PRAPARE - Administrator, Civil Service (Medical): No    Lack of Transportation (Non-Medical): No  Physical Activity: Inactive (09/25/2024)   Exercise Vital Sign    Days of Exercise per Week: 0 days    Minutes of Exercise per Session: 0 min  Stress: No Stress Concern Present (09/25/2024)   [employer] of Occupational Health - Occupational Stress Questionnaire    Feeling of Stress: Not at all  Recent Concern: Stress - Stress Concern Present (07/25/2024)   [employer] of Occupational Health - Occupational Stress Questionnaire    Feeling of Stress: To some extent  Social Connections: Moderately Isolated (09/25/2024)   Social Connection and Isolation Panel    Frequency of Communication with Friends and Family: More than three times a week    Frequency of Social Gatherings with Friends and Family: Three times a week    Attends Religious Services: Never    Active Member of Clubs or  Organizations: No    Attends Banker Meetings: Never    Marital Status: Married    Tobacco Counseling Ready to quit: Not Answered Counseling given: Not Answered    Clinical Intake:  Pre-visit preparation completed: Yes  Pain : No/denies pain     BMI - recorded: 29.4 Nutritional Status: BMI 25 -29 Overweight Nutritional Risks: None Diabetes: Yes CBG done?: No Did pt. bring in CBG monitor from home?: No  Lab Results  Component Value Date   HGBA1C 7.4 (H) 07/28/2024     How often do you need to have someone help you when you read instructions, pamphlets, or other written materials from your doctor or pharmacy?: 1 - Never  Interpreter Needed?: No  Information entered by :: JHONNIE DAS, LPN   Activities of Daily Living     09/25/2024   10:58 AM 09/21/2024  5:46 PM  In your present state of health, do you have any difficulty performing the following activities:  Hearing? 0 0  Vision? 0 0  Difficulty concentrating or making decisions? 0 0  Walking or climbing stairs? 1 1  Dressing or bathing? 0 0  Doing errands, shopping? 0 0  Preparing Food and eating ? N N  Using the Toilet? N N  In the past six months, have you accidently leaked urine? Y Y  Do you have problems with loss of bowel control? N N  Managing your Medications? N N  Managing your Finances? N N  Housekeeping or managing your Housekeeping? N N    Patient Care Team: Gareth Mliss FALCON, FNP as PCP - General (Nurse Practitioner) [place]  I have updated your Care Teams any recent Medical Services you may have received from other providers in the past year.     Assessment:   This is a routine wellness examination for [place].  Hearing/Vision screen Hearing Screening - Comments:: NO AIDS Vision Screening - Comments:: READERS- [place] [facility].- SEEN ONCE PER YEAR   Goals Addressed             This Visit's Progress    DIET - EAT MORE FRUITS AND  VEGETABLES         Depression Screen     09/25/2024   10:56 AM 07/28/2024    7:59 AM  PHQ 2/9 Scores  PHQ - 2 Score 0 0  PHQ- 9 Score 0     Fall Risk     09/21/2024    5:46 PM 07/28/2024    7:59 AM  Fall Risk   Falls in the past year? 0 0  Number falls in past yr: 0 0  Injury with Fall? 0 0  Risk for fall due to : No Fall Risks No Fall Risks  Follow up Falls evaluation completed;Falls prevention discussed Falls evaluation completed    MEDICARE RISK AT HOME:  Medicare Risk at Home Any stairs in or around the home?: Yes If so, are there any without handrails?: No Home free of loose throw rugs in walkways, pet beds, electrical cords, etc?: Yes Adequate lighting in your home to reduce risk of falls?: Yes Life alert?: No Use of a cane, walker or w/c?: No Grab bars in the bathroom?: No Shower chair or bench in shower?: No Elevated toilet seat or a handicapped toilet?: No  TIMED UP AND GO:  Was the test performed?  No  Cognitive Function: 6CIT completed        09/25/2024   11:00 AM  6CIT Screen  What Year? 0 points  What month? 0 points  What time? 0 points  Count back from 20 0 points  Months in reverse 0 points  Repeat phrase 2 points  Total Score 2 points    Immunizations Immunization History  Administered Date(s) Administered   Tdap 09/09/2019    Screening Tests Health Maintenance  Topic Date Due   COVID-19 Vaccine (1) Never done   Diabetic kidney evaluation - Urine ACR  Never done   Colonoscopy  Never done   Cervical Cancer Screening (HPV/Pap Cotest)  09/08/2022   Zoster Vaccines- Shingrix (1 of 2) 10/28/2024 (Originally 06/20/1988)   Influenza Vaccine  03/10/2025 (Originally 07/11/2024)   Pneumococcal Vaccine: 50+ Years (1 of 2 - PCV) 07/28/2025 (Originally 06/20/1988)   HEMOGLOBIN A1C  01/28/2025   Diabetic kidney evaluation - eGFR measurement  07/28/2025   FOOT EXAM  07/30/2025  OPHTHALMOLOGY EXAM  08/06/2025   Mammogram  09/11/2025    Medicare Annual Wellness (AWV)  09/25/2025   DTaP/Tdap/Td (2 - Td or Tdap) 09/08/2029   Hepatitis C Screening  Completed   HIV Screening  Completed   HPV VACCINES  Aged Out   Meningococcal B Vaccine  Aged Out   Hepatitis B Vaccines 19-59 Average Risk  Discontinued    Health Maintenance Items Addressed: DECLINES VACCINES; COLOGUARD UP TO DATE; MAMMOGRAM UP TO DATE  Additional Screening:  Vision Screening: Recommended annual ophthalmology exams for early detection of glaucoma and other disorders of the eye. Is the patient up to date with their annual eye exam?  Yes  Who is the provider or what is the name of the office in which the patient attends annual eye exams? [facility].  Dental Screening: Recommended annual dental exams for proper oral hygiene  Community Resource Referral / Chronic Care Management: CRR required this visit?  No   CCM required this visit?  No   Plan:    I have personally reviewed and noted the following in the patient's chart:   Medical and social history Use of alcohol, tobacco or illicit drugs  Current medications and supplements including opioid prescriptions. Patient is not currently taking opioid prescriptions. Functional ability and status Nutritional status Physical activity Advanced directives List of other physicians Hospitalizations, surgeries, and ER visits in previous 12 months Vitals Screenings to include cognitive, depression, and falls Referrals and appointments  In addition, I have reviewed and discussed with patient certain preventive protocols, quality metrics, and best practice recommendations. A written personalized care plan for preventive services as well as general preventive health recommendations were provided to patient.   Jhonnie GORMAN Das, LPN   [id number]   After Visit Summary: (MyChart) Due to this being a telephonic visit, the after visit summary with patients personalized plan was offered to patient via MyChart    Notes: Nothing significant to report at this time.

## 2024-10-16 ENCOUNTER — Other Ambulatory Visit (HOSPITAL_COMMUNITY): Payer: Self-pay

## 2024-10-24 ENCOUNTER — Other Ambulatory Visit: Payer: Self-pay | Admitting: Nurse Practitioner

## 2024-10-24 DIAGNOSIS — E039 Hypothyroidism, unspecified: Secondary | ICD-10-CM

## 2024-10-25 NOTE — Telephone Encounter (Signed)
 Requested Prescriptions  Pending Prescriptions Disp Refills   levothyroxine  (SYNTHROID ) 88 MCG tablet [Pharmacy Med Name: LEVOTHYROXINE  88 MCG TABLET] 90 tablet 0    Sig: TAKE 1 TABLET BY MOUTH EVERY DAY     Endocrinology:  Hypothyroid Agents Failed - 10/25/2024 11:40 AM      Failed - TSH in normal range and within 360 days    TSH  Date Value Ref Range Status  07/28/2024 7.11 (H) mIU/L Final    Comment:              Reference Range .           > or = 20 Years  0.40-4.50 .                Pregnancy Ranges           First trimester    0.26-2.66           Second trimester   0.55-2.73           Third trimester    0.43-2.91          Passed - Valid encounter within last 12 months    Recent Outpatient Visits           2 months ago Type 2 diabetes mellitus with hyperglycemia, without long-term current use of insulin [facility])   [place] [facility] Gareth Mliss FALCON, FNP   2 months ago Hypothyroidism, unspecified type   [facility] Gareth Mliss FALCON, FNP       Future Appointments             In 5 days Gareth, Mliss FALCON, FNP [facility] [place]

## 2024-10-30 ENCOUNTER — Encounter: Payer: Self-pay | Admitting: Nurse Practitioner

## 2024-10-30 ENCOUNTER — Ambulatory Visit (INDEPENDENT_AMBULATORY_CARE_PROVIDER_SITE_OTHER): Admitting: Nurse Practitioner

## 2024-10-30 VITALS — BP 142/88 | HR 73 | Temp 98.0°F | Ht 62.0 in | Wt 150.0 lb

## 2024-10-30 DIAGNOSIS — Z7985 Long-term (current) use of injectable non-insulin antidiabetic drugs: Secondary | ICD-10-CM | POA: Diagnosis not present

## 2024-10-30 DIAGNOSIS — E1165 Type 2 diabetes mellitus with hyperglycemia: Secondary | ICD-10-CM

## 2024-10-30 DIAGNOSIS — E039 Hypothyroidism, unspecified: Secondary | ICD-10-CM

## 2024-10-30 NOTE — Progress Notes (Signed)
 BP (!) 142/88   Pulse 73   Temp 98 F (36.7 C)   Ht 5' 2 (1.575 m)   Wt 150 lb (68 kg)   LMP  (LMP Unknown)   SpO2 98%   BMI 27.44 kg/m    Subjective:    Patient ID: Latoya Brady, female    DOB: Sep 05, 1971, 53 y.o.   MRN: [id number]  HPI: Latoya Brady is a 53 y.o. female  Chief Complaint  Patient presents with   Medical Management of Chronic Issues   Discussed the use of AI scribe software for clinical note transcription with the patient, who gave verbal consent to proceed.  History of Present Illness Latoya Brady is a 53 year old female with hypothyroidism and type 2 diabetes who presents for a three month follow-up.  Hypothyroidism - Currently taking levothyroxine  88 mcg daily - Previous TSH was 7.11, prompting an increase in levothyroxine  from 75 mcg to 88 mcg  Type 2 diabetes mellitus - Previously prescribed Ozempic , but switched to Mounjaro  due to cost - Unable to obtain Mounjaro  due to insurance issues - After insurance change, resumed Ozempic  at 0.25 mg weekly - Tolerates Ozempic  well; initial gastrointestinal symptoms have stabilized - Weight loss of 10 pounds since starting Ozempic  - Most recent A1c was 7.4 - Finds weekly insurance company calls about exercise and diet helpful  Medication access and adherence - Experienced stress related to previous pharmacy ([facility]) - Switched to a new pharmacy that provides delivery services   Elevated blood pressure reading BP Readings from Last 3 Encounters:  10/30/24 (!) 142/88  09/17/24 139/89  07/30/24 136/86   Recommend monitoring blood pressure.  Decrease sodium in diet.        09/25/2024   10:56 AM 07/28/2024    7:59 AM  Depression screen PHQ 2/9  Decreased Interest 0 0  Down, Depressed, Hopeless 0 0  PHQ - 2 Score 0 0  Altered sleeping 0   Tired, decreased energy 0   Change in appetite 0   Feeling bad or failure about yourself  0   Trouble concentrating 0   Moving slowly or  fidgety/restless 0   Suicidal thoughts 0   PHQ-9 Score 0    Difficult doing work/chores Not difficult at all      Data saved with a previous flowsheet row definition    Relevant past medical, surgical, family and social history reviewed and updated as indicated. Interim medical history since our last visit reviewed. Allergies and medications reviewed and updated.  Review of Systems  Constitutional: Negative for fever or weight change.  Respiratory: Negative for cough and shortness of breath.   Cardiovascular: Negative for chest pain or palpitations.  Gastrointestinal: Negative for abdominal pain, no bowel changes.  Musculoskeletal: Negative for gait problem or joint swelling.  Skin: Negative for rash.  Neurological: Negative for dizziness or headache.  No other specific complaints in a complete review of systems (except as listed in HPI above).      Objective:      BP (!) 142/88   Pulse 73   Temp 98 F (36.7 C)   Ht 5' 2 (1.575 m)   Wt 150 lb (68 kg)   LMP  (LMP Unknown)   SpO2 98%   BMI 27.44 kg/m    Wt Readings from Last 3 Encounters:  10/30/24 150 lb (68 kg)  07/30/24 161 lb 14.4 oz (73.4 kg)  07/28/24 161 lb 14.4 oz (73.4 kg)  Physical Exam VITALS: BP- 150/110 GENERAL: Alert, cooperative, well developed, no acute distress HEENT: Normocephalic, normal oropharynx, moist mucous membranes CHEST: Clear to auscultation bilaterally, no wheezes, rhonchi, or crackles CARDIOVASCULAR: Normal heart rate and rhythm, S1 and S2 normal without murmurs ABDOMEN: Soft, non-tender, non-distended, without organomegaly, normal bowel sounds EXTREMITIES: No cyanosis or edema NEUROLOGICAL: Cranial nerves grossly intact, moves all extremities without gross motor or sensory deficit  Results for orders placed or performed in visit on 08/06/24  HM DIABETES EYE EXAM   Collection Time: 08/06/24  1:23 PM  Result Value Ref Range   HM Diabetic Eye Exam No Retinopathy No Retinopathy           Assessment & Plan:   Problem List Items Addressed This Visit       Endocrine   Hypothyroidism - Primary   Relevant Orders   TSH   Type 2 diabetes mellitus with hyperglycemia, without long-term current use of insulin (HCC)   Relevant Medications   OZEMPIC , 0.25 OR 0.5 MG/DOSE, 2 MG/3ML SOPN   Other Relevant Orders   Microalbumin / creatinine urine ratio   Hemoglobin A1c     Assessment and Plan Assessment & Plan Type 2 diabetes mellitus with hyperglycemia Type 2 diabetes mellitus with hyperglycemia, currently managed with Ozempic . Insurance issues initially prevented access to medication, but now resolved with new insurance coverage. Currently on Ozempic  0.25 mg, tolerating well without significant side effects. Reports weight loss of 10 pounds and blood glucose levels all below 150 mg/dL after two hours of eating. - Continue Ozempic  0.25 mg as tolerated - Checked A1c level  Hypothyroidism Elevated TSH at 7.11. Previously on 75 mcg of levothyroxine , increased to 88 mcg. Compliance with medication is encouraged. - Rechecked TSH level - Encouraged taking levothyroxine  every morning  Elevated blood pressure reading Current blood pressure reading of 150/110 mmHg. Stress related to insurance issues may be contributing to elevated blood pressure. - Rechecked blood pressure before leaving -recommend decreasing sodium in diet.  Monitor blood pressure        Follow up plan: Return in about 3 months (around 01/30/2025) for follow up.

## 2024-10-31 ENCOUNTER — Ambulatory Visit: Payer: Self-pay | Admitting: Nurse Practitioner

## 2024-10-31 LAB — HEMOGLOBIN A1C
Hgb A1c MFr Bld: 5.5 % (ref <5.7)
Mean Plasma Glucose: 111 mg/dL
eAG (mmol/L): 6.2 mmol/L

## 2024-10-31 LAB — MICROALBUMIN / CREATININE URINE RATIO
Creatinine, Urine: 51 mg/dL (ref 20–275)
Microalb Creat Ratio: 10 mg/g{creat} (ref <30)
Microalb, Ur: 0.5 mg/dL

## 2024-10-31 LAB — TSH: TSH: 0.47 m[IU]/L

## 2024-11-10 ENCOUNTER — Other Ambulatory Visit (HOSPITAL_COMMUNITY): Payer: Self-pay

## 2024-11-11 ENCOUNTER — Other Ambulatory Visit (HOSPITAL_COMMUNITY): Payer: Self-pay

## 2024-11-11 ENCOUNTER — Telehealth: Payer: Self-pay

## 2024-11-11 DIAGNOSIS — E1165 Type 2 diabetes mellitus with hyperglycemia: Secondary | ICD-10-CM

## 2024-11-11 MED ORDER — OZEMPIC (0.25 OR 0.5 MG/DOSE) 2 MG/3ML ~~LOC~~ SOPN
0.2500 mg | PEN_INJECTOR | SUBCUTANEOUS | 1 refills | Status: AC
  Filled 2024-11-11: qty 3, 56d supply, fill #0
  Filled 2024-12-31 (×2): qty 3, 56d supply, fill #1

## 2024-11-11 NOTE — Progress Notes (Signed)
 Pharmacy Quality Measure Review  This patient is appearing on a report for being at risk of failing the adherence measure for diabetes medications this calendar year.   Medication: Ozempic  0.25 mg Last fill date: 09/24/24 for 28 day supply  Per chart review, patient was having insurance difficulties with Mounjaro , so is transitioning back to Ozempic . However, her prescription is out of refills. Will collaborate with embedded pharmacist to facilitate.   Woodie Jock, PharmD PGY1 Pharmacy Resident  11/11/2024

## 2024-11-11 NOTE — Progress Notes (Signed)
 Refill submitted for Ozempic  therapy. Last fill date 09/24/24  Latoya Brady, PharmD, CPP Clinical Pharmacist [facility] [phone]

## 2024-12-02 ENCOUNTER — Other Ambulatory Visit (HOSPITAL_COMMUNITY): Payer: Self-pay

## 2024-12-31 ENCOUNTER — Other Ambulatory Visit (HOSPITAL_COMMUNITY): Payer: Self-pay

## 2025-01-30 ENCOUNTER — Ambulatory Visit: Admitting: Nurse Practitioner

## 2025-10-01 ENCOUNTER — Ambulatory Visit
